# Patient Record
Sex: Male | Born: 1960 | Race: White | Hispanic: No | Smoking: Current every day smoker
Health system: Southern US, Community
[De-identification: ages and names within clinical notes are randomized; demographics above are authoritative.]

## PROBLEM LIST (undated history)

## (undated) DIAGNOSIS — I429 Cardiomyopathy, unspecified: Secondary | ICD-10-CM

## (undated) DIAGNOSIS — S069XAA Unspecified intracranial injury with loss of consciousness status unknown, initial encounter: Secondary | ICD-10-CM

## (undated) DIAGNOSIS — G4730 Sleep apnea, unspecified: Secondary | ICD-10-CM

## (undated) DIAGNOSIS — J449 Chronic obstructive pulmonary disease, unspecified: Secondary | ICD-10-CM

## (undated) DIAGNOSIS — R569 Unspecified convulsions: Secondary | ICD-10-CM

## (undated) DIAGNOSIS — I10 Essential (primary) hypertension: Secondary | ICD-10-CM

## (undated) DIAGNOSIS — S069X9A Unspecified intracranial injury with loss of consciousness of unspecified duration, initial encounter: Secondary | ICD-10-CM

## (undated) HISTORY — PX: LEG SURGERY: SHX1003

## (undated) HISTORY — PX: APPENDECTOMY: SHX54

---

## 2008-11-11 HISTORY — PX: CARDIAC CATHETERIZATION: SHX172

## 2012-12-25 ENCOUNTER — Encounter (HOSPITAL_COMMUNITY): Payer: Self-pay

## 2012-12-25 ENCOUNTER — Other Ambulatory Visit: Payer: Self-pay

## 2012-12-25 ENCOUNTER — Emergency Department (HOSPITAL_COMMUNITY): Payer: Medicaid Other

## 2012-12-25 ENCOUNTER — Emergency Department (HOSPITAL_COMMUNITY)
Admission: EM | Admit: 2012-12-25 | Discharge: 2012-12-25 | Disposition: A | Discharge status: Home / Self Care | Payer: Medicaid Other | Attending: Emergency Medicine | Admitting: Emergency Medicine

## 2012-12-25 DIAGNOSIS — F17200 Nicotine dependence, unspecified, uncomplicated: Secondary | ICD-10-CM | POA: Insufficient documentation

## 2012-12-25 DIAGNOSIS — S0993XA Unspecified injury of face, initial encounter: Secondary | ICD-10-CM | POA: Insufficient documentation

## 2012-12-25 DIAGNOSIS — S46909A Unspecified injury of unspecified muscle, fascia and tendon at shoulder and upper arm level, unspecified arm, initial encounter: Secondary | ICD-10-CM | POA: Insufficient documentation

## 2012-12-25 DIAGNOSIS — S161XXA Strain of muscle, fascia and tendon at neck level, initial encounter: Secondary | ICD-10-CM

## 2012-12-25 DIAGNOSIS — R0602 Shortness of breath: Secondary | ICD-10-CM | POA: Insufficient documentation

## 2012-12-25 DIAGNOSIS — I10 Essential (primary) hypertension: Secondary | ICD-10-CM | POA: Insufficient documentation

## 2012-12-25 DIAGNOSIS — R404 Transient alteration of awareness: Secondary | ICD-10-CM | POA: Insufficient documentation

## 2012-12-25 DIAGNOSIS — S139XXA Sprain of joints and ligaments of unspecified parts of neck, initial encounter: Secondary | ICD-10-CM | POA: Insufficient documentation

## 2012-12-25 DIAGNOSIS — R55 Syncope and collapse: Secondary | ICD-10-CM | POA: Insufficient documentation

## 2012-12-25 DIAGNOSIS — S0291XA Unspecified fracture of skull, initial encounter for closed fracture: Secondary | ICD-10-CM

## 2012-12-25 DIAGNOSIS — Y9289 Other specified places as the place of occurrence of the external cause: Secondary | ICD-10-CM | POA: Insufficient documentation

## 2012-12-25 DIAGNOSIS — S0280XA Fracture of other specified skull and facial bones, unspecified side, initial encounter for closed fracture: Secondary | ICD-10-CM | POA: Insufficient documentation

## 2012-12-25 DIAGNOSIS — W010XXA Fall on same level from slipping, tripping and stumbling without subsequent striking against object, initial encounter: Secondary | ICD-10-CM | POA: Insufficient documentation

## 2012-12-25 DIAGNOSIS — Z79899 Other long term (current) drug therapy: Secondary | ICD-10-CM | POA: Insufficient documentation

## 2012-12-25 DIAGNOSIS — J40 Bronchitis, not specified as acute or chronic: Secondary | ICD-10-CM

## 2012-12-25 DIAGNOSIS — S0990XA Unspecified injury of head, initial encounter: Secondary | ICD-10-CM | POA: Insufficient documentation

## 2012-12-25 DIAGNOSIS — S4980XA Other specified injuries of shoulder and upper arm, unspecified arm, initial encounter: Secondary | ICD-10-CM | POA: Insufficient documentation

## 2012-12-25 DIAGNOSIS — Y9389 Activity, other specified: Secondary | ICD-10-CM | POA: Insufficient documentation

## 2012-12-25 HISTORY — DX: Sleep apnea, unspecified: G47.30

## 2012-12-25 HISTORY — DX: Essential (primary) hypertension: I10

## 2012-12-25 LAB — CBC WITH DIFFERENTIAL/PLATELET
Eosinophils Relative: 4 % (ref 0–5)
HCT: 47.7 % (ref 39.0–52.0)
Lymphocytes Relative: 31 % (ref 12–46)
Lymphs Abs: 1.8 10*3/uL (ref 0.7–4.0)
MCV: 93.7 fL (ref 78.0–100.0)
Monocytes Absolute: 0.6 10*3/uL (ref 0.1–1.0)
Monocytes Relative: 9 % (ref 3–12)
RBC: 5.09 MIL/uL (ref 4.22–5.81)
WBC: 6 10*3/uL (ref 4.0–10.5)

## 2012-12-25 LAB — BASIC METABOLIC PANEL
CO2: 28 mEq/L (ref 19–32)
Calcium: 10 mg/dL (ref 8.4–10.5)
Creatinine, Ser: 1.02 mg/dL (ref 0.50–1.35)
Glucose, Bld: 94 mg/dL (ref 70–99)

## 2012-12-25 MED ORDER — OXYCODONE-ACETAMINOPHEN 5-325 MG PO TABS
2.0000 | ORAL_TABLET | Freq: Once | ORAL | Status: AC
  Administered 2012-12-25: 2 via ORAL
  Filled 2012-12-25: qty 2

## 2012-12-25 MED ORDER — BENZONATATE 200 MG PO CAPS: 200.0000 mg | ORAL_CAPSULE | Freq: Three times a day (TID) | ORAL | Status: DC | PRN

## 2012-12-25 MED ORDER — OXYCODONE-ACETAMINOPHEN 5-325 MG PO TABS: 1.0000 | ORAL_TABLET | Freq: Four times a day (QID) | ORAL | Status: DC | PRN
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
## 2012-12-25 NOTE — ED Notes (Signed)
Pt presents with c/o cough, nasal congestion, syncope episode with coughing last night. Pt reports hurt his neck and upper back last night during episode. Pt denies SOB and fever at this time. NAD noted no cough audible during exam. Pt reports coughing yellow sputum x 10 days. NAD noted at this time.

## 2012-12-25 NOTE — ED Notes (Signed)
Pt called to desk c/o SOB. Upon entering room pt did no appear to have increased WOB from previous exam. Pt still able to carry on conversation without signs of being winded.  EDP notified.

## 2012-12-25 NOTE — ED Notes (Signed)
Pt reports has been coughing for approx 10days.  Reports cough is productive with yellow sputum.  Last night pt had a coughing spell so he walked outside.  Reports slipped on wet grass, hit head neck on tree stump.  Pt says woke up feeling stiff all over this morning.  Unknown if has had fever.

## 2012-12-26 ENCOUNTER — Encounter (HOSPITAL_COMMUNITY): Payer: Self-pay | Admitting: Emergency Medicine

## 2012-12-26 NOTE — ED Provider Notes (Signed)
Medical screening examination/treatment/procedure(s) were performed by non-physician practitioner and as supervising physician I was immediately available for consultation/collaboration.   Shamel Galyean L Jordis Repetto, MD 12/26/12 1624 

## 2013-01-03 ENCOUNTER — Emergency Department (HOSPITAL_COMMUNITY)
Admission: EM | Admit: 2013-01-03 | Discharge: 2013-01-03 | Disposition: A | Discharge status: Home / Self Care | Payer: 59 | Attending: Emergency Medicine | Admitting: Emergency Medicine

## 2013-01-03 ENCOUNTER — Emergency Department (HOSPITAL_COMMUNITY): Payer: 59

## 2013-01-03 ENCOUNTER — Encounter (HOSPITAL_COMMUNITY): Payer: Self-pay | Admitting: *Deleted

## 2013-01-03 DIAGNOSIS — E876 Hypokalemia: Secondary | ICD-10-CM | POA: Insufficient documentation

## 2013-01-03 DIAGNOSIS — R569 Unspecified convulsions: Secondary | ICD-10-CM | POA: Insufficient documentation

## 2013-01-03 DIAGNOSIS — F17200 Nicotine dependence, unspecified, uncomplicated: Secondary | ICD-10-CM | POA: Insufficient documentation

## 2013-01-03 DIAGNOSIS — I10 Essential (primary) hypertension: Secondary | ICD-10-CM | POA: Insufficient documentation

## 2013-01-03 DIAGNOSIS — Z79899 Other long term (current) drug therapy: Secondary | ICD-10-CM | POA: Insufficient documentation

## 2013-01-03 DIAGNOSIS — F1010 Alcohol abuse, uncomplicated: Secondary | ICD-10-CM

## 2013-01-03 HISTORY — DX: Unspecified intracranial injury with loss of consciousness of unspecified duration, initial encounter: S06.9X9A

## 2013-01-03 HISTORY — DX: Unspecified intracranial injury with loss of consciousness status unknown, initial encounter: S06.9XAA

## 2013-01-03 HISTORY — DX: Unspecified convulsions: R56.9

## 2013-01-03 LAB — BASIC METABOLIC PANEL
GFR calc non Af Amer: >90 mL/min (ref >90)
Glucose, Bld: 110 mg/dL — ABNORMAL HIGH (ref 70–99)
Potassium: 2.9 mEq/L — ABNORMAL LOW (ref 3.5–5.1)
Sodium: 135 mEq/L (ref 135–145)

## 2013-01-03 LAB — CBC WITH DIFFERENTIAL/PLATELET
Basophils Absolute: 0 10*3/uL (ref 0.0–0.1)
Eosinophils Absolute: 0.1 10*3/uL (ref 0.0–0.7)
Lymphocytes Relative: 14 % (ref 12–46)
Lymphs Abs: 1.2 10*3/uL (ref 0.7–4.0)
MCH: 33.5 pg (ref 26.0–34.0)
Neutrophils Relative %: 77 % (ref 43–77)
Platelets: 152 10*3/uL (ref 150–400)
RBC: 4.24 MIL/uL (ref 4.22–5.81)
RDW: 13.7 % (ref 11.5–15.5)
WBC: 7.8 10*3/uL (ref 4.0–10.5)

## 2013-01-03 LAB — URINALYSIS, ROUTINE W REFLEX MICROSCOPIC
Hgb urine dipstick: NEGATIVE
Leukocytes, UA: NEGATIVE
Nitrite: NEGATIVE
Specific Gravity, Urine: 1.025 (ref 1.005–1.030)
Urobilinogen, UA: 0.2 mg/dL (ref 0.0–1.0)

## 2013-01-03 LAB — RAPID URINE DRUG SCREEN, HOSP PERFORMED
Amphetamines: NOT DETECTED
Tetrahydrocannabinol: POSITIVE — AB

## 2013-01-03 MED ORDER — POTASSIUM CHLORIDE CRYS ER 20 MEQ PO TBCR
60.0000 meq | EXTENDED_RELEASE_TABLET | Freq: Once | ORAL | Status: AC
Start: 2013-01-03 — End: 2013-01-03
  Administered 2013-01-03: 60 meq via ORAL
  Filled 2013-01-03: qty 3

## 2013-01-03 MED ORDER — POTASSIUM CHLORIDE 10 MEQ/100ML IV SOLN
10.0000 meq | Freq: Once | INTRAVENOUS | Status: AC
  Administered 2013-01-03: 10 meq via INTRAVENOUS
  Filled 2013-01-03: qty 100
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
## 2013-01-03 NOTE — ED Notes (Signed)
Left message for pt's father to come pick pt up.

## 2013-01-03 NOTE — ED Notes (Addendum)
Pt brought to er by [employer] with c/o altered mental status, ems contacted by family with c/o unresponsive, on ems arrival pt unresponsive to painful stimuli, report that they found the pt unresponsive when they went to "check on him" states that pt had complained of chest pain earlier, pt given 2 mg narcan iv by ems with minimal improvement in LOC. Pt has empty pill bottle of lorazepam 0.5mg  filled on 12/21/2012 that was empty. On arrival to er pt not responding to painful stimuli. Foley cath and additional iv placed with no response from pt,  Pt placed on oxygen at 3lpm via [place] with improvement in sat. To 98% VSS.

## 2013-01-03 NOTE — ED Notes (Signed)
Pt started stretching his arm, eyes open, smells of etoh, will respond to questions with grunts, pulling at foley, foley dc'd, Dr. Colon Branch at bedside, sister called er and advised that pt had a seizure about two hours ago and was slow to "come back" pt shakes his head yes when asked if he has had problems with seizures. Seizure pads in place on stretcher,

## 2013-02-12 ENCOUNTER — Emergency Department (HOSPITAL_COMMUNITY): Payer: Medicaid Other

## 2013-02-12 ENCOUNTER — Encounter (HOSPITAL_COMMUNITY): Payer: Self-pay | Admitting: *Deleted

## 2013-02-12 ENCOUNTER — Emergency Department (HOSPITAL_COMMUNITY)
Admission: EM | Admit: 2013-02-12 | Discharge: 2013-02-12 | Disposition: A | Discharge status: Home / Self Care | Payer: Medicaid Other | Attending: Emergency Medicine | Admitting: Emergency Medicine

## 2013-02-12 DIAGNOSIS — R059 Cough, unspecified: Secondary | ICD-10-CM | POA: Insufficient documentation

## 2013-02-12 DIAGNOSIS — R55 Syncope and collapse: Secondary | ICD-10-CM | POA: Insufficient documentation

## 2013-02-12 DIAGNOSIS — Z79899 Other long term (current) drug therapy: Secondary | ICD-10-CM | POA: Insufficient documentation

## 2013-02-12 DIAGNOSIS — Z87828 Personal history of other (healed) physical injury and trauma: Secondary | ICD-10-CM | POA: Insufficient documentation

## 2013-02-12 DIAGNOSIS — I10 Essential (primary) hypertension: Secondary | ICD-10-CM | POA: Insufficient documentation

## 2013-02-12 DIAGNOSIS — R05 Cough: Secondary | ICD-10-CM | POA: Insufficient documentation

## 2013-02-12 DIAGNOSIS — R51 Headache: Secondary | ICD-10-CM | POA: Insufficient documentation

## 2013-02-12 DIAGNOSIS — M542 Cervicalgia: Secondary | ICD-10-CM | POA: Insufficient documentation

## 2013-02-12 DIAGNOSIS — Z8669 Personal history of other diseases of the nervous system and sense organs: Secondary | ICD-10-CM | POA: Insufficient documentation

## 2013-02-12 DIAGNOSIS — Z720 Tobacco use: Secondary | ICD-10-CM

## 2013-02-12 DIAGNOSIS — F17200 Nicotine dependence, unspecified, uncomplicated: Secondary | ICD-10-CM | POA: Insufficient documentation

## 2013-02-12 MED ORDER — ACETAMINOPHEN 325 MG PO TABS
650.0000 mg | ORAL_TABLET | Freq: Once | ORAL | Status: AC
Start: 2013-02-12 — End: 2013-02-12
  Administered 2013-02-12: 650 mg via ORAL
  Filled 2013-02-12: qty 2

## 2013-02-12 NOTE — ED Notes (Signed)
Pt has been seen recently for same. Reports having a cough and its progressively worsening, everytime he coughs he passes out. Reports coughing this am and falling and hit back of head on concrete. No acute distress noted at this time.
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
## 2013-02-14 NOTE — ED Provider Notes (Signed)
I saw and evaluated the patient, reviewed the resident's note and I agree with the findings and plan.  52 yo male with h/o syncope a/w coughing. This is a chronic issue.  Pt has h/o TBI.  He has been seen for these syncopal episodes in the past and has a current neurology c/s pending, but is not interested in following up with neurology.  On exam - AFVSS, normal neuro exam, speech, stance, MS, gait.  Small hematoma in occipital area.  CT head and neck are negative.    Pt encouraged to stop smoking and to f/u with neurology and PCM.  ER precautions given.    Darlys Gales, MD 02/14/13 [phone]

## 2013-02-22 ENCOUNTER — Encounter (HOSPITAL_COMMUNITY): Payer: Self-pay | Admitting: Emergency Medicine

## 2013-02-22 ENCOUNTER — Observation Stay (HOSPITAL_COMMUNITY)
Admission: EM | Admit: 2013-02-22 | Discharge: 2013-02-23 | Disposition: A | Discharge status: Home / Self Care | Payer: Medicaid Other | Attending: Internal Medicine | Admitting: Internal Medicine

## 2013-02-22 ENCOUNTER — Emergency Department (HOSPITAL_COMMUNITY): Payer: Medicaid Other

## 2013-02-22 DIAGNOSIS — R079 Chest pain, unspecified: Secondary | ICD-10-CM | POA: Diagnosis present

## 2013-02-22 DIAGNOSIS — Z720 Tobacco use: Secondary | ICD-10-CM | POA: Diagnosis present

## 2013-02-22 DIAGNOSIS — I10 Essential (primary) hypertension: Secondary | ICD-10-CM | POA: Insufficient documentation

## 2013-02-22 DIAGNOSIS — Z79899 Other long term (current) drug therapy: Secondary | ICD-10-CM | POA: Insufficient documentation

## 2013-02-22 DIAGNOSIS — G4730 Sleep apnea, unspecified: Secondary | ICD-10-CM | POA: Insufficient documentation

## 2013-02-22 DIAGNOSIS — R0789 Other chest pain: Principal | ICD-10-CM | POA: Insufficient documentation

## 2013-02-22 DIAGNOSIS — F17200 Nicotine dependence, unspecified, uncomplicated: Secondary | ICD-10-CM | POA: Insufficient documentation

## 2013-02-22 DIAGNOSIS — Z87820 Personal history of traumatic brain injury: Secondary | ICD-10-CM | POA: Insufficient documentation

## 2013-02-22 DIAGNOSIS — G4733 Obstructive sleep apnea (adult) (pediatric): Secondary | ICD-10-CM

## 2013-02-22 DIAGNOSIS — F1010 Alcohol abuse, uncomplicated: Secondary | ICD-10-CM | POA: Diagnosis present

## 2013-02-22 HISTORY — DX: Cardiomyopathy, unspecified: I42.9

## 2013-02-22 HISTORY — DX: Chronic obstructive pulmonary disease, unspecified: J44.9

## 2013-02-22 LAB — CBC
HCT: 46.5 % (ref 39.0–52.0)
HCT: 44.6 % (ref 39.0–52.0)
Hemoglobin: 16.6 g/dL (ref 13.0–17.0)
Hemoglobin: 15.9 g/dL (ref 13.0–17.0)
MCHC: 35.7 g/dL (ref 30.0–36.0)
MCV: 94.7 fL (ref 78.0–100.0)
MCV: 95.7 fL (ref 78.0–100.0)
RBC: 4.91 MIL/uL (ref 4.22–5.81)
WBC: 7.2 10*3/uL (ref 4.0–10.5)
WBC: 5.1 10*3/uL (ref 4.0–10.5)

## 2013-02-22 LAB — POCT I-STAT TROPONIN I: Troponin i, poc: 0 ng/mL (ref 0.00–0.08)

## 2013-02-22 LAB — POCT I-STAT 3, ART BLOOD GAS (G3+)
Acid-base deficit: 3 mmol/L — ABNORMAL HIGH (ref 0.0–2.0)
O2 Saturation: 95 %
TCO2: 23 mmol/L (ref 0–100)

## 2013-02-22 LAB — BASIC METABOLIC PANEL
BUN: 18 mg/dL (ref 6–23)
CO2: 19 mEq/L (ref 19–32)
Calcium: 10.1 mg/dL (ref 8.4–10.5)
Chloride: 101 mEq/L (ref 96–112)
Creatinine, Ser: 1.11 mg/dL (ref 0.50–1.35)
GFR calc Af Amer: 87 mL/min — ABNORMAL LOW (ref >90)
GFR calc non Af Amer: 75 mL/min — ABNORMAL LOW (ref >90)
Glucose, Bld: 85 mg/dL (ref 70–99)

## 2013-02-22 LAB — LIPID PANEL
HDL: 53 mg/dL (ref >39)
LDL Cholesterol: 82 mg/dL (ref 0–99)
Triglycerides: 57 mg/dL (ref <150)

## 2013-02-22 LAB — TROPONIN I: Troponin I: <0.3 ng/mL (ref <0.30)

## 2013-02-22 LAB — CREATININE, SERUM
GFR calc Af Amer: >90 mL/min (ref >90)
GFR calc non Af Amer: >90 mL/min (ref >90)

## 2013-02-22 MED ORDER — THIAMINE HCL 100 MG/ML IJ SOLN
100.0000 mg | Freq: Every day | INTRAMUSCULAR | Status: DC
  Filled 2013-02-22 (×2): qty 1

## 2013-02-22 MED ORDER — ONDANSETRON HCL 4 MG/2ML IJ SOLN
4.0000 mg | Freq: Once | INTRAMUSCULAR | Status: AC
  Administered 2013-02-22: 4 mg via INTRAVENOUS

## 2013-02-22 MED ORDER — SODIUM CHLORIDE 0.9 % IV BOLUS (SEPSIS)
500.0000 mL | Freq: Once | INTRAVENOUS | Status: AC
  Administered 2013-02-22: 500 mL via INTRAVENOUS

## 2013-02-22 MED ORDER — METOPROLOL TARTRATE 25 MG PO TABS
25.0000 mg | ORAL_TABLET | Freq: Two times a day (BID) | ORAL | Status: DC
  Administered 2013-02-22: 25 mg via ORAL
  Filled 2013-02-22 (×4): qty 1

## 2013-02-22 MED ORDER — ONDANSETRON HCL 4 MG/2ML IJ SOLN
4.0000 mg | Freq: Three times a day (TID) | INTRAMUSCULAR | Status: DC
  Filled 2013-02-22: qty 2

## 2013-02-22 MED ORDER — FOLIC ACID 1 MG PO TABS
1.0000 mg | ORAL_TABLET | Freq: Every day | ORAL | Status: DC
  Administered 2013-02-22 – 2013-02-23 (×2): 1 mg via ORAL
  Filled 2013-02-22 (×2): qty 1

## 2013-02-22 MED ORDER — ONDANSETRON HCL 4 MG/2ML IJ SOLN: 4.0000 mg | Freq: Four times a day (QID) | INTRAMUSCULAR | Status: DC | PRN

## 2013-02-22 MED ORDER — HYDROCODONE-ACETAMINOPHEN 7.5-325 MG PO TABS
1.0000 | ORAL_TABLET | ORAL | Status: DC | PRN
  Administered 2013-02-22 – 2013-02-23 (×5): 1 via ORAL
  Filled 2013-02-22 (×5): qty 1

## 2013-02-22 MED ORDER — ALBUTEROL SULFATE (5 MG/ML) 0.5% IN NEBU: 2.5000 mg | INHALATION_SOLUTION | RESPIRATORY_TRACT | Status: DC | PRN

## 2013-02-22 MED ORDER — IPRATROPIUM BROMIDE 0.02 % IN SOLN: 0.5000 mg | RESPIRATORY_TRACT | Status: DC | PRN

## 2013-02-22 MED ORDER — LORAZEPAM 1 MG PO TABS: 1.0000 mg | ORAL_TABLET | Freq: Four times a day (QID) | ORAL | Status: DC | PRN

## 2013-02-22 MED ORDER — ASPIRIN 81 MG PO CHEW
324.0000 mg | CHEWABLE_TABLET | Freq: Once | ORAL | Status: AC
  Administered 2013-02-22: 324 mg via ORAL
  Filled 2013-02-22: qty 4

## 2013-02-22 MED ORDER — ADULT MULTIVITAMIN W/MINERALS CH
1.0000 | ORAL_TABLET | Freq: Every day | ORAL | Status: DC
  Administered 2013-02-22 – 2013-02-23 (×2): 1 via ORAL
  Filled 2013-02-22 (×2): qty 1

## 2013-02-22 MED ORDER — MORPHINE SULFATE 2 MG/ML IJ SOLN: 2.0000 mg | INTRAMUSCULAR | Status: DC | PRN

## 2013-02-22 MED ORDER — VITAMIN B-1 100 MG PO TABS
100.0000 mg | ORAL_TABLET | Freq: Every day | ORAL | Status: DC
  Administered 2013-02-22 – 2013-02-23 (×2): 100 mg via ORAL
  Filled 2013-02-22 (×2): qty 1

## 2013-02-22 MED ORDER — MORPHINE SULFATE 4 MG/ML IJ SOLN
4.0000 mg | Freq: Once | INTRAMUSCULAR | Status: DC
  Filled 2013-02-22: qty 1

## 2013-02-22 MED ORDER — ACETAMINOPHEN 325 MG PO TABS: 650.0000 mg | ORAL_TABLET | ORAL | Status: DC | PRN

## 2013-02-22 MED ORDER — ASPIRIN EC 325 MG PO TBEC
325.0000 mg | DELAYED_RELEASE_TABLET | Freq: Every day | ORAL | Status: DC
  Administered 2013-02-23: 325 mg via ORAL
  Filled 2013-02-22 (×2): qty 1

## 2013-02-22 MED ORDER — LORAZEPAM 2 MG/ML IJ SOLN: 1.0000 mg | Freq: Four times a day (QID) | INTRAMUSCULAR | Status: DC | PRN

## 2013-02-22 MED ORDER — NITROGLYCERIN 0.4 MG SL SUBL
0.4000 mg | SUBLINGUAL_TABLET | SUBLINGUAL | Status: AC | PRN
  Administered 2013-02-22 (×3): 0.4 mg via SUBLINGUAL
  Filled 2013-02-22: qty 25

## 2013-02-22 MED ORDER — GI COCKTAIL ~~LOC~~: 30.0000 mL | Freq: Four times a day (QID) | ORAL | Status: DC | PRN

## 2013-02-22 MED ORDER — HEPARIN SODIUM (PORCINE) 5000 UNIT/ML IJ SOLN
5000.0000 [IU] | Freq: Three times a day (TID) | INTRAMUSCULAR | Status: DC
  Administered 2013-02-22 – 2013-02-23 (×3): 5000 [IU] via SUBCUTANEOUS
  Filled 2013-02-22 (×6): qty 1

## 2013-02-22 NOTE — ED Notes (Signed)
PT saturations decreasing to 91 on 2 L.  Pt in upright position.  O2 increased to 4L, o2 if 91.  Pt states he feels "foggy".

## 2013-02-22 NOTE — ED Provider Notes (Signed)
Medical screening examination/treatment/procedure(s) were conducted as a shared visit with non-physician practitioner(s) and myself.  I personally evaluated the patient during the encounter.  Several hours of chest pressure tightness heaviness shortness breath nausea sweats improving and now minimal with nitroglycerin, lungs clear however on 4 L nasal cannula oxygen while feeling sleepy but awake and oriented x3 the patient desaturated to 89% however spontaneously improved back to 95% so d-dimer ordered after initial ECG and troponin are unremarkable.  Hurman Horn, MD 02/23/13 [phone]

## 2013-02-22 NOTE — H&P (Signed)
Date: 02/22/2013               Patient Name:  JAKAI RISSE MRN: [id number]  DOB: 10-24-1960 Age / Sex: 52 y.o., male   PCP: Dr. Chestine Spore Pritts ([place] Croon, [place])         Medical Service: Internal Medicine Teaching Service         Attending Physician: Dr. Hurman Horn, MD    First Contact: Dr. Doristine Locks Pager: [phone]  Second Contact: Dr. Sinclair Ship Pager: [phone]       After Hours (After 5p/  First Contact Pager: [phone]  weekends / holidays): Second Contact Pager: [phone]   Chief Complaint: chest pain  History of Present Illness: Mr. Plessinger is a 52 yo male with history significant for hypertension, obstructive sleep apnea, GERD, tobacco abuse, and periodic alcohol abuse who presents with complaints of left sided chest pressure with abrupt onset on morning of presentation to the ED.  He states that he woke up not "feeling quite right" but without chest pain and as he was preparing for work he suddenly felt mid-chest pressure which resolved spontaneously with 1-2 minutes.  Within 15 minutes more intense chest pain "brought me to my knees" and was associated with diaphoresis, shortness of breath, nausea, and radiation to his left neck and arm.  Not associated with change in position or inspiration. Pain resolved ED after nitroglycerin and aspirin. He states that this pain felt similar to his chest pain experienced in 2010 whereby he subsequently had cardiac catherization demonstrating non-obstructive CAD.  He has not had chest pain since that time.  He has been without his blood pressure and GERD medications for ~2-3 weeks.  Meds: Current Facility-Administered Medications  Medication Dose Route Frequency Provider Last Rate Last Dose  . morphine 4 MG/ML injection 4 mg  4 mg Intravenous Once Fayrene Helper, PA-C       Current Outpatient Prescriptions  Medication Sig Dispense Refill  . calcium carbonate (TUMS - DOSED IN MG ELEMENTAL CALCIUM) 500 MG chewable tablet Chew 1 tablet by mouth  daily as needed for heartburn.      Marland Kitchen HYDROcodone-acetaminophen (NORCO) 7.5-325 MG per tablet Take 1 tablet by mouth every 4 (four) hours as needed for pain.       Marland Kitchen LORazepam (ATIVAN) 0.5 MG tablet Take 0.5 mg by mouth every 6 (six) hours as needed for anxiety.       . metoprolol (LOPRESSOR) 50 MG tablet Take 50 mg by mouth daily.      . Naproxen Sodium (ALEVE PO) Take 2 tablets by mouth daily as needed (headache).            Zantac OTC  Allergies: Allergies as of 02/22/2013  . (No Known Allergies)   Past Medical History  Diagnosis Date  . Hypertension   . Sleep apnea   . Traumatic brain injury     2013  . Cardiomyopathy   . Seizures    Past Surgical History  Procedure Laterality Date  . Appendectomy    . Leg surgery    . Cardiac catheterization  11/2008   No family history on file. History   Social History  . Marital Status: Widowed    Spouse Name: N/A    Number of Children: N/A  . Years of Education: N/A   Occupational History  . Not on file.   Social History Main Topics  . Smoking status: Current Every Day Smoker  . Smokeless tobacco: Not on  file  . Alcohol Use: No  . Drug Use: No  . Sexual Activity: Not on file   Other Topics Concern  . Not on file   Social History Narrative  . No narrative on file    Review of Systems: Constitutional:  Endorse diaphoresis, Denies fever, chills, appetite change and fatigue.  HEENT: Denies congestion, sore throat, rhinorrhea  Respiratory: Endorse SOB with chest pain episode, Denies DOE, cough, chest tightness, and wheezing.  Cardiovascular: Endorses chest pain and palpitations, Denies leg swelling.  Gastrointestinal: Endorses nausea, Denies vomiting, abdominal pain, diarrhea, constipation, blood in stool  Genitourinary: Denies dysuria, urgency, frequency, hematuria, flank pain and difficulty urinating.  Musculoskeletal: Endorses chronic back pain, Denies myalgias, joint swelling   Skin: Denies rash  Neurological: Told  he previously had seizure after drinking ETOH, h/o syncope after coughing, Denies dizziness, weakness, light-headedness, numbness and headaches.   Psychiatric/ Behavioral: Endorses depressed mood, Denies suicidal ideation     Physical Exam: Blood pressure 125/68, pulse 67, temperature 97.9 F (36.6 C), temperature source Oral, resp. rate 17, SpO2 98.00%. General: Well-developed, well-nourished, White male, in no acute distress; Head: Normocephalic, atraumatic, small well healed laceration to top of scalp, PERRLA, EOMI, Moist mucous membranes Neck: supple, no masses, no carotid Bruits, no JVD appreciated. Lungs: Normal respiratory effort. Clear to auscultation bilaterally from apices to bases without crackles or wheezes appreciated. Heart: normal rate, regular rhythm, normal S1 and S2, no gallop, murmur, or rubs appreciated. Abdomen: BS normoactive. Soft, Nondistended, non-tender. No masses or organomegaly appreciated. Extremities: No pretibial edema, distal pulses intact Neurologic: grossly non-focal, alert and oriented x3, appropriate and cooperative throughout examination.   Lab results: Basic Metabolic Panel:  Recent Labs  16/10/96 0554  NA 136  K 3.9  CL 101  CO2 19  GLUCOSE 85  BUN 18  CREATININE 1.11  CALCIUM 10.1   CBC:  Recent Labs  02/22/13 0554  WBC 7.2  HGB 16.6  HCT 46.5  MCV 94.7  PLT 178   BNP:  Recent Labs  02/22/13 0554  PROBNP 32.3   D-Dimer:  Recent Labs  02/22/13 0928  DDIMER <0.27   Urine Drug Screen: Drugs of Abuse     Component Value Date/Time   LABOPIA NONE DETECTED 01/03/2013 0155   COCAINSCRNUR NONE DETECTED 01/03/2013 0155   LABBENZ NONE DETECTED 01/03/2013 0155   AMPHETMU NONE DETECTED 01/03/2013 0155   THCU POSITIVE* 01/03/2013 0155   LABBARB NONE DETECTED 01/03/2013 0155    Alcohol Level:  Recent Labs  02/22/13 1037  ETH <11   Troponin (Point of Care Test)  Recent Labs  02/22/13 0605  TROPIPOC 0.00      Imaging results:  Dg Chest 2 View  02/22/2013   *RADIOLOGY REPORT*  Clinical Data: Chest pain  CHEST - 2 VIEW  Comparison: 12/25/2012  Findings: Heart size and vascularity are normal.  Negative for heart failure.  Mild left lower lobe atelectasis.  Right lung is clear.  Negative for pleural effusion.  IMPRESSION: Mild left lower lobe atelectasis.   Original Report Authenticated By: Janeece Riggers, M.D.    Other results: EKG: normal EKG, normal sinus rhythm, unchanged from previous tracings.  Assessment & Plan by Problem: Mr. Hanners is a 52 yo male with hypertension, tobacco abuse and positive cardiac family history admitted with sudden onset chest pain concerning for acute coronary syndrome.  1. Chest pain: likely unstable angina.  Thus far troponins negative with EKG without ischemic changes.Pulmonary Embolism low likelihood given low Wells and  Geneva Score with negative D-dimer. History and CXR without signs of dissection or esophageal rupture; clinical exam, history and EKG without pericarditis indicators; pt has been without PPI for several weeks but denies cholecystitis symptoms or increased reflux symptoms including no right subcostal pain, epigastric pain, melena, anorexia, vomiting or fever. TIMI score of 2 indicating 8% risk at 14 days for all cause mortality.  Given his risk factors of hypertension and tobacco abuse will admit for further evaluation. -cycle troponins -EKG with repeat chest pain -2D ECHO -check lipid panel -cont beta-blocker -cont ASA -consider statin and ACEi therapy   2. Hypertension: controlled currently at 120/66 on exam with pulse 78bpm -will continue metoprolol 50 mg qd home dosage but divide bid -consider ACEi  3. Tobacco abuse: currently not motivated to stop.  Under stress and grief.  4. Alcohol abuse: pt open to grief counseling during hospitalization -CIWA precautions  5. DVT ppx: Hep SQ   Dispo: Disposition is deferred at this time, awaiting  improvement of current medical problems. Anticipated discharge in approximately 2-3 day(s).   The patient does have a current PCP (Dr. Loren Racer) and does not need an [facility] hospital follow-up appointment after discharge.  The patient does not have transportation limitations that hinder transportation to clinic appointments.  Signed: Manuela Schwartz, MD 02/22/2013, 11:32 AM

## 2013-02-22 NOTE — ED Notes (Signed)
Pt ambulated with assistance.  O2 decreased to 93% with hr of 72. Denied sob or chest pain.

## 2013-02-22 NOTE — ED Notes (Signed)
Pt. reports mid chest pain / pressure with SOB , nausea and diaphoresis onset this morning .

## 2013-02-22 NOTE — ED Notes (Signed)
Woke up this morning with x 2 chest tightness and nausea; hx. Of vertigol

## 2013-02-22 NOTE — ED Provider Notes (Signed)
CSN: [id number]     Arrival date & time 02/22/13  0544 History   First MD Initiated Contact with Patient 02/22/13 [phone]     Chief Complaint  Patient presents with  . Chest Pain   (Consider location/radiation/quality/duration/timing/severity/associated sxs/prior Treatment) HPI  52 year old male with hx of TBI in 2013, HTN, seizure presents for evaluation of chest discomfort.  Patient reports this morning around 4am when he was getting ready to go to walk he experienced a sensation of chest pressure to his mid chest radiates to his left shoulder. Onset was gradual for the first episode but sudden on the second episode.  Describes sensation as "a car sitting on my chest". He endorsed nausea, lightheadedness, shortness of breath and diaphoretic. Symptoms lasting for several minutes, resolved, came back again for additional 10 minutes, improves and then worsening again. Nothing seems to make the symptoms worse but resting seems to make it better. Describe pain as a 4/10 at this time.  Nausea seems to improve after receiving Zofran. No complaints of fever, chills, headache, productive cough, abdominal pain, back pain, numbness or weakness. He does report significant family history of cardiac disease. He also had a cardiac stress test with cardiac catheterization done 2010. States he does have minor blockage on heart catheterization. Patient felt today's symptoms reminiscing the previous event in 2010 when he was hospitalized for chest pain.  He is a smoker but trying to quit.  No prior hx of PE/DVT, no recent surgery, prolonged bed rest, recent travel, or hx of CA.     Past Medical History  Diagnosis Date  . Hypertension   . Sleep apnea   . Seizures   . Traumatic brain injury     2013   Past Surgical History  Procedure Laterality Date  . Appendectomy    . Leg surgery     No family history on file. History  Substance Use Topics  . Smoking status: Current Every Day Smoker  . Smokeless tobacco:  Not on file  . Alcohol Use: No    Review of Systems  All other systems reviewed and are negative.    Allergies  Review of patient's allergies indicates no known allergies.  Home Medications   Current Outpatient Rx  Name  Route  Sig  Dispense  Refill  . calcium carbonate (TUMS - DOSED IN MG ELEMENTAL CALCIUM) 500 MG chewable tablet   Oral   Chew 1 tablet by mouth daily as needed for heartburn.         Marland Kitchen HYDROcodone-acetaminophen (NORCO) 7.5-325 MG per tablet   Oral   Take 1 tablet by mouth every 4 (four) hours as needed for pain.          Marland Kitchen LORazepam (ATIVAN) 0.5 MG tablet   Oral   Take 0.5 mg by mouth every 6 (six) hours as needed for anxiety.          . metoprolol (LOPRESSOR) 50 MG tablet   Oral   Take 50 mg by mouth daily.         . Naproxen Sodium (ALEVE PO)   Oral   Take 2 tablets by mouth daily as needed (headache).          BP 142/82  Pulse 86  Temp(Src) 97.9 F (36.6 C) (Oral)  Resp 22  SpO2 92% Physical Exam  Nursing note and vitals reviewed. Constitutional: He is oriented to person, place, and time. He appears well-developed and well-nourished. No distress.  Awake, alert, nontoxic  appearance  HENT:  Head: Atraumatic.  Eyes: Conjunctivae are normal. Right eye exhibits no discharge. Left eye exhibits no discharge.  Neck: Normal range of motion. Neck supple.  Cardiovascular: Normal rate, regular rhythm and intact distal pulses.   Pulmonary/Chest: Effort normal. No respiratory distress. He exhibits no tenderness.  Abdominal: Soft. There is no tenderness. There is no rebound.  Musculoskeletal: He exhibits no edema and no tenderness.  ROM appears intact, no obvious focal weakness  BLE: No palpable cords, erythema, edema, negative Homan Sign  Neurological: He is alert and oriented to person, place, and time.  Skin: Skin is warm and dry. No rash noted.  Psychiatric: He has a normal mood and affect.    ED Course  Procedures (including critical  care time)   Date: 02/22/2013  Rate: 88  Rhythm: normal sinus rhythm  QRS Axis: normal  Intervals: normal  ST/T Wave abnormalities: normal  Conduction Disutrbances: none  Narrative Interpretation:   Old EKG Reviewed: no prior for comparison  7:56 AM Patient here with chest discomfort concerning for ACS. He has a normal EKG and negative troponin initially. He is currently reporting a 4/10 chest pain. Will get aspirin, nitroglycerin, and morphine. We'll continue workup and will consult cardiology for further management.    8:42 AM Pt report some improvement after receiving SL nitro. Denies SOB at this time.  Low risk for PE however O2 remains in the low 90s while on 3L O2.  CXR shows mild left lower lobe atelectasis.  Pt does appears drowsy, but able to answer all questions appropriately.  My attending was made aware and evaluate pt.  Recommend D-dimer to r/o PE, and will also check ABG as pt has hx of sleep apnea and may be retaining CO2.  Will continue close monitor.    9:06 AM ABG with normal CO2 level and normal pH.  Will also check BNP.  Patient states his chest discomfort has fully resolved after receiving 3 doses of sublingual nitroglycerin and aspirin.   10:15 AM She has a negative d-dimer, normal proBNP, normal hemoglobin, and electrolytes are reassuring. He is currently chest pain-free.  Continue to remain drowsy but arousable.  No headache.  Will check O2 sats while ambulating.  Will consult for admission for chest pain rule out with obs.    10:25 AM i have consulted internal medicine-unassigned, Dr. Bosie Clos agrees to see pt in ER and will admit for further care.  Labs Review Labs Reviewed  BASIC METABOLIC PANEL - Abnormal; Notable for the following:    GFR calc non Af Amer 75 (*)    GFR calc Af Amer 87 (*)    All other components within normal limits  POCT I-STAT 3, BLOOD GAS (G3+) - Abnormal; Notable for the following:    pO2, Arterial 79.0 (*)    Acid-base deficit 3.0  (*)    All other components within normal limits  CBC  PRO B NATRIURETIC PEPTIDE  D-DIMER, QUANTITATIVE  POCT I-STAT TROPONIN I   Imaging Review Dg Chest 2 View  02/22/2013   *RADIOLOGY REPORT*  Clinical Data: Chest pain  CHEST - 2 VIEW  Comparison: 12/25/2012  Findings: Heart size and vascularity are normal.  Negative for heart failure.  Mild left lower lobe atelectasis.  Right lung is clear.  Negative for pleural effusion.  IMPRESSION: Mild left lower lobe atelectasis.   Original Report Authenticated By: Janeece Riggers, M.D.    MDM   1. Chest pain    BP 125/68  Pulse 67  Temp(Src) 97.9 F (36.6 C) (Oral)  Resp 17  SpO2 98%  I have reviewed nursing notes and vital signs. I personally reviewed the imaging tests through PACS system  I reviewed available ER/hospitalization records thought the EMR     Fayrene Helper, PA-C 02/22/13 1026

## 2013-02-22 NOTE — ED Notes (Signed)
PT a

## 2013-02-22 NOTE — Progress Notes (Signed)
Utilization review completed.  

## 2013-02-22 NOTE — ED Notes (Signed)
Dr Fonnie Jarvis at bedside.  Pt stats continue to decrease to 89 on 4L.  Pt ao x 4, but appears drowsy.

## 2013-02-22 NOTE — ED Notes (Signed)
Schooler with internal medicine at bedside.

## 2013-02-22 NOTE — ED Notes (Signed)
Patient transported to CT 

## 2013-02-22 NOTE — ED Notes (Signed)
Hx. Of vertigo; nausea with quick movements, hard to keep eyes without feeling dizzy; lasts for a moment.

## 2013-02-22 NOTE — Consult Note (Signed)
HPI: 52 year old male with past medical history of hypertension, COPD, sleep apnea for evaluation of chest pain. Patient had an episode of chest pain in 2010. Cardiac catheterization was apparently performed in [place] but he did not have stents placed. He has had intermittent chest pain since then. It typically occurs with "stress". It is not have exertional chest pain and it is not related to food. His chest pain typically lasts 2 minutes and resolves. It is substernal and radiates to the shoulder. He had a more severe episode today and there is associated nausea, shortness of breath and diaphoresis and he presented to the emergency room. He is presently pain-free. He denies dyspnea on exertion or exertional chest pain.  Medications Prior to Admission  Medication Sig Dispense Refill  . calcium carbonate (TUMS - DOSED IN MG ELEMENTAL CALCIUM) 500 MG chewable tablet Chew 1 tablet by mouth daily as needed for heartburn.      Marland Kitchen HYDROcodone-acetaminophen (NORCO) 7.5-325 MG per tablet Take 1 tablet by mouth every 4 (four) hours as needed for pain.       Marland Kitchen LORazepam (ATIVAN) 0.5 MG tablet Take 0.5 mg by mouth every 6 (six) hours as needed for anxiety.       . metoprolol (LOPRESSOR) 50 MG tablet Take 50 mg by mouth daily.      . Naproxen Sodium (ALEVE PO) Take 2 tablets by mouth daily as needed (headache).        No Known Allergies  Past Medical History  Diagnosis Date  . Hypertension   . Sleep apnea   . Traumatic brain injury     2013  . Cardiomyopathy   . Seizures   . COPD (chronic obstructive pulmonary disease)     Past Surgical History  Procedure Laterality Date  . Appendectomy    . Leg surgery    . Cardiac catheterization  11/2008    History   Social History  . Marital Status: Widowed    Spouse Name: N/A    Number of Children: N/A  . Years of Education: N/A   Occupational History  . [employer]   Social History Main Topics  . Smoking status: Current Every Day Smoker    . Smokeless tobacco: Not on file  . Alcohol Use: 8.4 oz/week    14 Cans of beer per week     Comment: will sometimes drink until he "losses count"  . Drug Use: No  . Sexual Activity: Not on file   Other Topics Concern  . Not on file   Social History Narrative  . No narrative on file    Family History  Problem Relation Age of Onset  . Heart failure Father   . Diabetes Mother   . Diabetes Brother   . Heart failure Paternal Grandfather   . Diabetes Father     ROS:  no fevers or chills, productive cough, hemoptysis, dysphasia, odynophagia, melena, hematochezia, dysuria, hematuria, rash, seizure activity, orthopnea, PND, pedal edema, claudication. Remaining systems are negative.  Physical Exam:   Blood pressure 130/84, pulse 53, temperature 97.6 F (36.4 C), temperature source Oral, resp. rate 18, SpO2 97.00%.  General:  Well developed/well nourished in NAD Skin warm/dry Patient not depressed No peripheral clubbing Back-normal HEENT-normal/normal eyelids Neck supple/normal carotid upstroke bilaterally; no bruits; no JVD; no thyromegaly chest - CTA/ normal expansion CV - RRR/normal S1 and S2; no murmurs, rubs or gallops;  PMI nondisplaced Abdomen -NT/ND, no HSM, no mass, + bowel sounds, no bruit 2+ femoral pulses,  no bruits Ext-no edema, chords, 2+ DP Neuro-grossly nonfocal  ECG sinus rhythm with no ST changes.  Results for orders placed during the hospital encounter of 02/22/13 (from the past 48 hour(s))  CBC     Status: None   Collection Time    02/22/13  5:54 AM      Result Value Range   WBC 7.2  4.0 - 10.5 K/uL   RBC 4.91  4.22 - 5.81 MIL/uL   Hemoglobin 16.6  13.0 - 17.0 g/dL   HCT 82.9  56.2 - 13.0 %   MCV 94.7  78.0 - 100.0 fL   MCH 33.8  26.0 - 34.0 pg   MCHC 35.7  30.0 - 36.0 g/dL   RDW 86.5  78.4 - 69.6 %   Platelets 178  150 - 400 K/uL  BASIC METABOLIC PANEL     Status: Abnormal   Collection Time    02/22/13  5:54 AM      Result Value Range    Sodium 136  135 - 145 mEq/L   Potassium 3.9  3.5 - 5.1 mEq/L   Chloride 101  96 - 112 mEq/L   CO2 19  19 - 32 mEq/L   Glucose, Bld 85  70 - 99 mg/dL   BUN 18  6 - 23 mg/dL   Creatinine, Ser 2.95  0.50 - 1.35 mg/dL   Calcium 28.4  8.4 - 13.2 mg/dL   GFR calc non Af Amer 75 (*) >90 mL/min   GFR calc Af Amer 87 (*) >90 mL/min   Comment: (NOTE)     The eGFR has been calculated using the CKD EPI equation.     This calculation has not been validated in all clinical situations.     eGFR's persistently <90 mL/min signify possible Chronic Kidney     Disease.  PRO B NATRIURETIC PEPTIDE     Status: None   Collection Time    02/22/13  5:54 AM      Result Value Range   Pro B Natriuretic peptide (BNP) 32.3  0 - 125 pg/mL  POCT I-STAT TROPONIN I     Status: None   Collection Time    02/22/13  6:05 AM      Result Value Range   Troponin i, poc 0.00  0.00 - 0.08 ng/mL   Comment 3            Comment: Due to the release kinetics of cTnI,     a negative result within the first hours     of the onset of symptoms does not rule out     myocardial infarction with certainty.     If myocardial infarction is still suspected,     repeat the test at appropriate intervals.  POCT I-STAT 3, BLOOD GAS (G3+)     Status: Abnormal   Collection Time    02/22/13  9:02 AM      Result Value Range   pH, Arterial 7.353  7.350 - 7.450   pCO2 arterial 39.4  35.0 - 45.0 mmHg   pO2, Arterial 79.0 (*) 80.0 - 100.0 mmHg   Bicarbonate 21.9  20.0 - 24.0 mEq/L   TCO2 23  0 - 100 mmol/L   O2 Saturation 95.0     Acid-base deficit 3.0 (*) 0.0 - 2.0 mmol/L   Collection site RADIAL, ALLEN'S TEST ACCEPTABLE     Drawn by RT     Sample type ARTERIAL    D-DIMER, QUANTITATIVE  Status: None   Collection Time    02/22/13  9:28 AM      Result Value Range   D-Dimer, Quant <0.27  0.00 - 0.48 ug/mL-FEU   Comment:            AT THE INHOUSE ESTABLISHED CUTOFF     VALUE OF 0.48 ug/mL FEU,     THIS ASSAY HAS BEEN DOCUMENTED      IN THE LITERATURE TO HAVE     A SENSITIVITY AND NEGATIVE     PREDICTIVE VALUE OF AT LEAST     98 TO 99%.  THE TEST RESULT     SHOULD BE CORRELATED WITH     AN ASSESSMENT OF THE CLINICAL     PROBABILITY OF DVT / VTE.  ETHANOL     Status: None   Collection Time    02/22/13 10:37 AM      Result Value Range   Alcohol, Ethyl (B) <11  0 - 11 mg/dL   Comment:            LOWEST DETECTABLE LIMIT FOR     SERUM ALCOHOL IS 11 mg/dL     FOR MEDICAL PURPOSES ONLY  CBC     Status: Abnormal   Collection Time    02/22/13  2:49 PM      Result Value Range   WBC 5.1  4.0 - 10.5 K/uL   RBC 4.66  4.22 - 5.81 MIL/uL   Hemoglobin 15.9  13.0 - 17.0 g/dL   HCT 40.9  81.1 - 91.4 %   MCV 95.7  78.0 - 100.0 fL   MCH 34.1 (*) 26.0 - 34.0 pg   MCHC 35.7  30.0 - 36.0 g/dL   RDW 78.2  95.6 - 21.3 %   Platelets 140 (*) 150 - 400 K/uL  TROPONIN I     Status: None   Collection Time    02/22/13  2:49 PM      Result Value Range   Troponin I <0.30  <0.30 ng/mL   Comment:            Due to the release kinetics of cTnI,     a negative result within the first hours     of the onset of symptoms does not rule out     myocardial infarction with certainty.     If myocardial infarction is still suspected,     repeat the test at appropriate intervals.    Dg Chest 2 View  02/22/2013   *RADIOLOGY REPORT*  Clinical Data: Chest pain  CHEST - 2 VIEW  Comparison: 12/25/2012  Findings: Heart size and vascularity are normal.  Negative for heart failure.  Mild left lower lobe atelectasis.  Right lung is clear.  Negative for pleural effusion.  IMPRESSION: Mild left lower lobe atelectasis.   Original Report Authenticated By: Janeece Riggers, M.D.    Assessment/Plan 1 chest pain-symptoms are extremely atypical. Electrocardiogram is normal. Agree with cycling enzymes. If negative he can have an outpatient functional study for risk stratification. 2 hypertension-continue present blood pressure medications and follow. 3 tobacco  abuse-patient counseled on discontinuing.  Olga Millers MD 02/22/2013, 4:02 PM

## 2013-02-23 DIAGNOSIS — I517 Cardiomegaly: Secondary | ICD-10-CM

## 2013-02-23 LAB — TROPONIN I: Troponin I: <0.3 ng/mL (ref <0.30)

## 2013-02-23 MED ORDER — ASPIRIN 81 MG PO TBEC: 81.0000 mg | DELAYED_RELEASE_TABLET | Freq: Every day | ORAL | Status: DC

## 2013-02-23 MED ORDER — CALCIUM CARBONATE ANTACID 500 MG PO CHEW: 1.0000 | CHEWABLE_TABLET | Freq: Every day | ORAL | Status: AC | PRN

## 2013-02-23 MED ORDER — METOPROLOL TARTRATE 50 MG PO TABS: 50.0000 mg | ORAL_TABLET | Freq: Every day | ORAL | Status: DC

## 2013-02-23 NOTE — Discharge Summary (Signed)
Name: Robert Buck MRN: [id number] DOB: 05/20/1961 52 y.o. PCP: Dr. Chestine Spore [facility], [place])   Date of Admission: 02/22/2013  5:44 AM Date of Discharge: 02/23/2013 Attending Physician: Jonah Blue  Discharge Diagnosis:  1. Chest pain 2. Hypertension 3. Alcohol and tobacco abuse  Discharge Medications:   Medication List         aspirin 81 MG EC tablet  Take 1 tablet (81 mg total) by mouth daily.     calcium carbonate 500 MG chewable tablet  Commonly known as:  TUMS - dosed in mg elemental calcium  Chew 1 tablet (200 mg of elemental calcium total) by mouth daily as needed for heartburn.     HYDROcodone-acetaminophen 7.5-325 MG per tablet  Commonly known as:  NORCO  Take 1 tablet by mouth every 4 (four) hours as needed for pain.     LORazepam 0.5 MG tablet  Commonly known as:  ATIVAN  Take 0.5 mg by mouth every 6 (six) hours as needed for anxiety.     metoprolol 50 MG tablet  Commonly known as:  LOPRESSOR  Take 1 tablet (50 mg total) by mouth daily.        Disposition and follow-up:   Robert Buck was discharged from [facility] in stable condition.  At the hospital follow up visit please address:  1.  Chest pain recurrance  2.  Labs / imaging needed at time of follow-up: Outpatient stress testing (to be done by [facility])  3.  Pending labs/ test needing follow-up: None   Follow-up Appointments: Follow-up Information   Call Quintella Reichert, MD. [facility] cardiology will call you about your appointment next week)    Specialty:  Cardiology   Contact information:   [address] [place] [address] [phone]       Schedule an appointment as soon as possible for a visit with Dr. Loren Racer.   Contact information:   [place], [place]      Discharge Instructions: Discharge Orders   Future Orders Complete By Expires   Diet - low sodium heart healthy  As directed    Increase activity slowly  As  directed       Consultations: Treatment Team:  Rounding L[facility]cardiology, MD  Procedures Performed:  Dg Chest 2 View  02/22/2013   *RADIOLOGY REPORT*  Clinical Data: Chest pain  CHEST - 2 VIEW  Comparison: 12/25/2012  Findings: Heart size and vascularity are normal.  Negative for heart failure.  Mild left lower lobe atelectasis.  Right lung is clear.  Negative for pleural effusion.  IMPRESSION: Mild left lower lobe atelectasis.   Original Report Authenticated By: Janeece Riggers, M.D.   Dg Cervical Spine Complete  02/12/2013   *RADIOLOGY REPORT*  Clinical Data: Syncope, fall  CERVICAL SPINE - COMPLETE 4+ VIEW  Comparison: Cervical spine CT dated 12/25/2012  Findings: Cervical spine is visualized to C7-T1 on the lateral view.  Reversal of normal cervical lordosis.  No evidence of fracture or dislocation.  Vertebral body heights are maintained.  Dens appears intact.  Lateral masses of C1 are symmetric.  No prevertebral soft tissue swelling.  Mild multilevel degenerative changes of the lower cervical spine.  Bilateral neural foramina are patent.  Visualized lung apices are clear.  IMPRESSION: No fracture or dislocation is seen.  Mild multilevel degenerative changes.   Original Report Authenticated By: Charline Bills, M.D.   Ct Head Wo Contrast  02/12/2013   *RADIOLOGY REPORT*  Clinical Data: Loss  of consciousness; recent trauma  CT HEAD WITHOUT CONTRAST  Technique:  Contiguous axial images were obtained from the base of the skull through the vertex without contrast. There was obtained within 24 hours of patient arrival at the emergency department.  Comparison: January 03, 2013  Findings: There is mild diffuse atrophy.  There is no mass, hemorrhage, extra-axial fluid collection, or midline shift.  There is minimal small vessel disease in the centra semiovale bilaterally.  Gray-white compartments are otherwise normal.  There is a right superior occipital scalp hematoma.  There is a stable linear fracture,  nondisplaced, in the more inferior right occipital bone.  No new fracture is appreciable on this study. Mastoid air cells bilaterally are clear.  IMPRESSION: Stable nondisplaced right occipital bone fracture.  There is a new scalp hematoma in the right occipital region.  There is atrophy with minimal small vessel disease.  There is no intracranial hemorrhage or extra-axial fluid.  No acute appearing infarct.   Original Report Authenticated By: Bretta Bang, M.D.    2D Echo:  Study Conclusions  - Left ventricle: The cavity size was normal. Wall thickness was increased in a pattern of mild LVH. The estimated ejection fraction was 60%. Wall motion was normal; there were no regional wall motion abnormalities. - Right ventricle: The cavity size was normal. Systolic function was normal. Transthoracic echocardiography. M-mode, complete 2D, spectral Doppler, and color Doppler. Height: Height: 177.8cm. Height: 70in. Weight: Weight: 98.9kg. Weight: 217.5lb. Body mass index: BMI: 31.3kg/m^2. Body surface area: BSA: 2.45m^2. Blood pressure: 106/63. Patient status: Inpatient. Location: Bedside.   Cardiac Cath: None  Admission HPI:  Robert Buck is a 52 yo male with history significant for hypertension, obstructive sleep apnea, GERD, tobacco abuse, and periodic alcohol abuse who presents with complaints of left sided chest pressure with abrupt onset on morning of presentation to the ED. He states that he woke up not "feeling quite right" but without chest pain and as he was preparing for work he suddenly felt mid-chest pressure which resolved spontaneously with 1-2 minutes. Within 15 minutes more intense chest pain "brought me to my knees" and was associated with diaphoresis, shortness of breath, nausea, and radiation to his left neck and arm. Not associated with change in position or inspiration. Pain resolved ED after nitroglycerin and aspirin. He states that this pain felt similar to his chest pain  experienced in 2010 whereby he subsequently had cardiac catherization demonstrating non-obstructive CAD. He has not had chest pain since that time. He has been without his blood pressure and GERD medications for ~2-3 weeks.   Hospital Course by problem list:   1. Chest pain - Patient presented with chest pain concerning for ACS. It resolved in the ED s/p sublingual NG and ASA. Likely unstable angina vs. GERD in the setting of 2-3 weeks medication noncompliance. No concerning EKG changes on admission. Trop negative x3. D-Dimer negative. Pro-BNP normal. Lipid panel was wnl (TC 146, LDL 82). TIMI score of 2 indicating 8% risk. 10-yr ASCVD risk 4.2%. Echo showed LV wall thickness increased in a pattern of mild LVH, ejection fraction 60%, wall motion was normal, there were no regional wall motion abnormalities. Cardiology recommended an outpatient functional study for risk stratification, which he will have done here in [facility] with [facility] before returning home to [facility]. We began daily ASA 81mg  on discharge.  2. HTN - His blood pressure was stable on his home dose of metoprolol.  3. Alcohol and tobacco abuse - EtOH was negative  here. He was placed on CIWA protocol but exhibited no signs or symptoms of alcohol withdrawal. Counseling on alcohol and smoking cessation were provided.   Discharge Vitals:   BP 106/63  Pulse 56  Temp(Src) 97.6 F (36.4 C) (Oral)  Resp 18  Ht 5\' 10"  (1.778 m)  Wt 218 lb 11.2 oz (99.202 kg)  BMI 31.38 kg/m2  SpO2 99%  Discharge Labs:   Results for orders placed during the hospital encounter of 02/22/13 (from the past 24 hour(s))  LIPID PANEL     Status: None   Collection Time    02/22/13  2:49 PM      Result Value Range   Cholesterol 146  0 - 200 mg/dL   Triglycerides 57  <119 mg/dL   HDL 53  >14 mg/dL   Total CHOL/HDL Ratio 2.8     VLDL 11  0 - 40 mg/dL   LDL Cholesterol 82  0 - 99 mg/dL  CBC     Status: Abnormal   Collection Time     02/22/13  2:49 PM      Result Value Range   WBC 5.1  4.0 - 10.5 K/uL   RBC 4.66  4.22 - 5.81 MIL/uL   Hemoglobin 15.9  13.0 - 17.0 g/dL   HCT 78.2  95.6 - 21.3 %   MCV 95.7  78.0 - 100.0 fL   MCH 34.1 (*) 26.0 - 34.0 pg   MCHC 35.7  30.0 - 36.0 g/dL   RDW 08.6  57.8 - 46.9 %   Platelets 140 (*) 150 - 400 K/uL  CREATININE, SERUM     Status: None   Collection Time    02/22/13  2:49 PM      Result Value Range   Creatinine, Ser 0.93  0.50 - 1.35 mg/dL   GFR calc non Af Amer >90  >90 mL/min   GFR calc Af Amer >90  >90 mL/min  TROPONIN I     Status: None   Collection Time    02/22/13  2:49 PM      Result Value Range   Troponin I <0.30  <0.30 ng/mL  TROPONIN I     Status: None   Collection Time    02/22/13  9:10 PM      Result Value Range   Troponin I <0.30  <0.30 ng/mL  TROPONIN I     Status: None   Collection Time    02/23/13 12:32 AM      Result Value Range   Troponin I <0.30  <0.30 ng/mL    Signed: Vivi Barrack, MD 02/24/2013, 10:04 PM   Time Spent on Discharge: 30 minutes Services Ordered on Discharge: None Equipment Ordered on Discharge: None

## 2013-02-23 NOTE — H&P (Signed)
INTERNAL MEDICINE TEACHING SERVICE Attending Admission Note  Date: 02/23/2013  Patient name: Robert Buck  Medical record number: [id number]  Date of birth: 26-May-1961    I have seen and evaluated Robert Buck and discussed their care with the Residency Team.   52 yr old male w/ hx OSA, HTN, tobacco abuse, hx alcohol abuse, GERD, presented with CP.  Descriptions of his CP make this atypical in nature.  EKG without ST changes. He has ruled out for ACS.  Outpatient stress testing recommended by cardiology.  He is CP free at this time.  I would not change his home medication regimen.  Counseled on tobacco cessation.  Medically stable to D/C home.  Jonah Blue, DO, FACP Faculty [facility] Internal Medicine Residency Program 02/23/2013, 11:49 AM

## 2013-02-23 NOTE — Progress Notes (Signed)
Subjective: Patient seen at the bedside. He feels well this morning. Denies chest pain, SOB, nausea, vomiting, diarrhea. He will only be in [place] until Sept. 25 but is willing to see cardiology beforehand for outpatient risk stratification.  Objective: Vital signs in last 24 hours: Filed Vitals:   02/22/13 2000 02/22/13 2138 02/23/13 0147 02/23/13 0759  BP: 120/76  115/62 106/63  Pulse: 52 56 74 56  Temp: 98.3 F (36.8 C)  97.8 F (36.6 C) 97.6 F (36.4 C)  TempSrc: Oral  Oral Oral  Resp: 18  18 18   Height: 5\' 10"  (1.778 m)     Weight: 218 lb 11.2 oz (99.202 kg)     SpO2: 95%  96% 99%   Weight change:   Intake/Output Summary (Last 24 hours) at 02/23/13 1255 Last data filed at 02/23/13 0656  Gross per 24 hour  Intake    960 ml  Output    625 ml  Net    335 ml   Physical Exam: General: Well-developed, well-nourished, White male, in no acute distress; Head: Normocephalic, atraumatic, small well healed laceration to top of scalp, PERRLA, EOMI, Moist mucous membranes Neck: supple, no masses, no carotid Bruits, no JVD appreciated. Lungs: Normal respiratory effort. Clear to auscultation bilaterally from apices to bases without crackles or wheezes appreciated. Heart: normal rate, regular rhythm, normal S1 and S2, no gallop, murmur, or rubs appreciated. Abdomen: BS normoactive. Soft, Nondistended, non-tender. No masses or organomegaly appreciated. Extremities: No pretibial edema, distal pulses intact Neurologic: grossly non-focal, alert and oriented x3, appropriate and cooperative throughout examination.  Lab Results: Basic Metabolic Panel:  Recent Labs Lab 02/22/13 0554 02/22/13 1449  NA 136  --   K 3.9  --   CL 101  --   CO2 19  --   GLUCOSE 85  --   BUN 18  --   CREATININE 1.11 0.93  CALCIUM 10.1  --    CBC:  Recent Labs Lab 02/22/13 0554 02/22/13 1449  WBC 7.2 5.1  HGB 16.6 15.9  HCT 46.5 44.6  MCV 94.7 95.7  PLT 178 140*   Cardiac  Enzymes:  Recent Labs Lab 02/22/13 1449 02/22/13 2110 02/23/13 0032  TROPONINI <0.30 <0.30 <0.30   BNP:  Recent Labs Lab 02/22/13 0554  PROBNP 32.3   D-Dimer:  Recent Labs Lab 02/22/13 0928  DDIMER <0.27   Fasting Lipid Panel:  Recent Labs Lab 02/22/13 1449  CHOL 146  HDL 53  LDLCALC 82  TRIG 57  CHOLHDL 2.8   Alcohol Level:  Recent Labs Lab 02/22/13 1037  ETH <11   Studies/Results: Dg Chest 2 View  02/22/2013   *RADIOLOGY REPORT*  Clinical Data: Chest pain  CHEST - 2 VIEW  Comparison: 12/25/2012  Findings: Heart size and vascularity are normal.  Negative for heart failure.  Mild left lower lobe atelectasis.  Right lung is clear.  Negative for pleural effusion.  IMPRESSION: Mild left lower lobe atelectasis.   Original Report Authenticated By: Janeece Riggers, M.D.   Medications: I have reviewed the patient's current medications. Scheduled Meds: . aspirin EC  325 mg Oral Daily  . folic acid  1 mg Oral Daily  . heparin subcutaneous  5,000 Units Subcutaneous Q8H  . metoprolol tartrate  25 mg Oral BID  . multivitamin with minerals  1 tablet Oral Daily  . thiamine  100 mg Oral Daily   Continuous Infusions:  PRN Meds:.acetaminophen, albuterol, gi cocktail, HYDROcodone-acetaminophen, ipratropium, LORazepam, LORazepam, morphine injection, ondansetron (ZOFRAN) IV  Assessment/Plan: Robert Buck is a 52 yo male with history significant for hypertension, obstructive sleep apnea, GERD, tobacco abuse, and periodic alcohol abuse who presents with complaints of left sided chest pain.  #Left sided chest pain - Resolved. Likely unstable angina vs. GERD in the setting of 2-3 weeks medication noncompliance. No concerning EKG changes. Trop negative x3. D-Dimer negative. Pro-BNP normal. TIMI score of 2 indicating 8% risk. 10-yr ASCVD risk 4.2%. Echo shows LV wall thickness increased in a pattern of mild LVH, ejection fraction 60%, wall motion was normal, there were no regional wall  motion abnormalities. - Appreciate cardiology recs - will arrange outpatient functional study for risk stratification here in [facility] - ASA daily - Medically stable for discharge  #HTN - Stable. - Continue home metoprolol  #Alcohol abuse - EtOH negative. No signs or symptoms of alcohol withdrawal. - Continue CIWA protocol  #COPD - Stable. - Continue duonebs prn  #DVT PPX - subq heparin  Dispo: Disposition is deferred at this time, awaiting improvement of current medical problems.  Anticipated discharge in approximately 1-3 day(s).   The patient does have a current PCP (Pcp Not In System) and does need an [facility] hospital follow-up appointment after discharge.  The patient does not have transportation limitations that hinder transportation to clinic appointments.  .Services Needed at time of discharge: Y = Yes, Blank = No PT:   OT:   RN:   Equipment:   Other:     LOS: 1 day   Vivi Barrack, MD 02/23/2013, 12:55 PM

## 2013-02-23 NOTE — Progress Notes (Signed)
  Echocardiogram 2D Echocardiogram has been performed.  Georgian Co 02/23/2013, 9:37 AM

## 2013-02-23 NOTE — Progress Notes (Signed)
SUBJECTIVE:  No further CP  OBJECTIVE:   Vitals:   Filed Vitals:   02/22/13 2000 02/22/13 2138 02/23/13 0147 02/23/13 0759  BP: 120/76  115/62 106/63  Pulse: 52 56 74 56  Temp: 98.3 F (36.8 C)  97.8 F (36.6 C) 97.6 F (36.4 C)  TempSrc: Oral  Oral Oral  Resp: 18  18 18   Height: 5\' 10"  (1.778 m)     Weight: 99.202 kg (218 lb 11.2 oz)     SpO2: 95%  96% 99%   I&O's:   Intake/Output Summary (Last 24 hours) at 02/23/13 0942 Last data filed at 02/23/13 0656  Gross per 24 hour  Intake    960 ml  Output    625 ml  Net    335 ml   TELEMETRY: Reviewed telemetry pt in NSR:     PHYSICAL EXAM General: Well developed, well nourished, in no acute distress Head: Eyes PERRLA, No xanthomas.   Normal cephalic and atramatic  Lungs:   Clear bilaterally to auscultation and percussion. Heart:   HRRR S1 S2 Pulses are 2+ & equal. Abdomen: Bowel sounds are positive, abdomen soft and non-tender without masses  Extremities:   No clubbing, cyanosis or edema.  DP +1 Neuro: Alert and oriented X 3. Psych:  Good affect, responds appropriately   LABS: Basic Metabolic Panel:  Recent Labs  95/28/41 0554 02/22/13 1449  NA 136  --   K 3.9  --   CL 101  --   CO2 19  --   GLUCOSE 85  --   BUN 18  --   CREATININE 1.11 0.93  CALCIUM 10.1  --    Liver Function Tests: No results found for this basename: AST, ALT, ALKPHOS, BILITOT, PROT, ALBUMIN,  in the last 72 hours No results found for this basename: LIPASE, AMYLASE,  in the last 72 hours CBC:  Recent Labs  02/22/13 0554 02/22/13 1449  WBC 7.2 5.1  HGB 16.6 15.9  HCT 46.5 44.6  MCV 94.7 95.7  PLT 178 140*   Cardiac Enzymes:  Recent Labs  02/22/13 1449 02/22/13 2110 02/23/13 0032  TROPONINI <0.30 <0.30 <0.30   BNP: No components found with this basename: POCBNP,  D-Dimer:  Recent Labs  02/22/13 0928  DDIMER <0.27   Hemoglobin A1C: No results found for this basename: HGBA1C,  in the last 72 hours Fasting Lipid  Panel:  Recent Labs  02/22/13 1449  CHOL 146  HDL 53  LDLCALC 82  TRIG 57  CHOLHDL 2.8   Thyroid Function Tests: No results found for this basename: TSH, T4TOTAL, FREET3, T3FREE, THYROIDAB,  in the last 72 hours Anemia Panel: No results found for this basename: VITAMINB12, FOLATE, FERRITIN, TIBC, IRON, RETICCTPCT,  in the last 72 hours Coag Panel:   No results found for this basename: INR, PROTIME    RADIOLOGY: Dg Chest 2 View  02/22/2013   *RADIOLOGY REPORT*  Clinical Data: Chest pain  CHEST - 2 VIEW  Comparison: 12/25/2012  Findings: Heart size and vascularity are normal.  Negative for heart failure.  Mild left lower lobe atelectasis.  Right lung is clear.  Negative for pleural effusion.  IMPRESSION: Mild left lower lobe atelectasis.   Original Report Authenticated By: Janeece Riggers, M.D.   Dg Cervical Spine Complete  02/12/2013   *RADIOLOGY REPORT*  Clinical Data: Syncope, fall  CERVICAL SPINE - COMPLETE 4+ VIEW  Comparison: Cervical spine CT dated 12/25/2012  Findings: Cervical spine is visualized to C7-T1 on the lateral  view.  Reversal of normal cervical lordosis.  No evidence of fracture or dislocation.  Vertebral body heights are maintained.  Dens appears intact.  Lateral masses of C1 are symmetric.  No prevertebral soft tissue swelling.  Mild multilevel degenerative changes of the lower cervical spine.  Bilateral neural foramina are patent.  Visualized lung apices are clear.  IMPRESSION: No fracture or dislocation is seen.  Mild multilevel degenerative changes.   Original Report Authenticated By: Charline Bills, M.D.   Ct Head Wo Contrast  02/12/2013   *RADIOLOGY REPORT*  Clinical Data: Loss of consciousness; recent trauma  CT HEAD WITHOUT CONTRAST  Technique:  Contiguous axial images were obtained from the base of the skull through the vertex without contrast. There was obtained within 24 hours of patient arrival at the emergency department.  Comparison: January 03, 2013  Findings:  There is mild diffuse atrophy.  There is no mass, hemorrhage, extra-axial fluid collection, or midline shift.  There is minimal small vessel disease in the centra semiovale bilaterally.  Gray-white compartments are otherwise normal.  There is a right superior occipital scalp hematoma.  There is a stable linear fracture, nondisplaced, in the more inferior right occipital bone.  No new fracture is appreciable on this study. Mastoid air cells bilaterally are clear.  IMPRESSION: Stable nondisplaced right occipital bone fracture.  There is a new scalp hematoma in the right occipital region.  There is atrophy with minimal small vessel disease.  There is no intracranial hemorrhage or extra-axial fluid.  No acute appearing infarct.   Original Report Authenticated By: Bretta Bang, M.D.   Assessment/Plan  1 chest pain-symptoms are extremely atypical. Electrocardiogram is normal. Cardiac enzymes negative x 3 so he can have an outpatient functional study for risk stratification. I will have the [facility] PA schedule outpt nuclear study this week since he will only be in [place] this week. 2 hypertension-continue present blood pressure medications and follow.  3 tobacco abuse-patient counseled on discontinuing     Quintella Reichert, MD  02/23/2013  9:42 AM

## 2013-02-24 ENCOUNTER — Other Ambulatory Visit: Payer: Self-pay

## 2013-02-24 ENCOUNTER — Emergency Department (HOSPITAL_COMMUNITY): Payer: Medicaid Other

## 2013-02-24 ENCOUNTER — Emergency Department (HOSPITAL_COMMUNITY)
Admission: EM | Admit: 2013-02-24 | Discharge: 2013-02-24 | Disposition: A | Discharge status: Home / Self Care | Payer: Medicaid Other | Attending: Emergency Medicine | Admitting: Emergency Medicine

## 2013-02-24 ENCOUNTER — Encounter (HOSPITAL_COMMUNITY): Payer: Self-pay | Admitting: *Deleted

## 2013-02-24 DIAGNOSIS — R05 Cough: Secondary | ICD-10-CM | POA: Insufficient documentation

## 2013-02-24 DIAGNOSIS — Z87820 Personal history of traumatic brain injury: Secondary | ICD-10-CM | POA: Insufficient documentation

## 2013-02-24 DIAGNOSIS — I10 Essential (primary) hypertension: Secondary | ICD-10-CM | POA: Insufficient documentation

## 2013-02-24 DIAGNOSIS — R55 Syncope and collapse: Secondary | ICD-10-CM

## 2013-02-24 DIAGNOSIS — Z8669 Personal history of other diseases of the nervous system and sense organs: Secondary | ICD-10-CM | POA: Insufficient documentation

## 2013-02-24 DIAGNOSIS — J441 Chronic obstructive pulmonary disease with (acute) exacerbation: Secondary | ICD-10-CM | POA: Insufficient documentation

## 2013-02-24 DIAGNOSIS — F17200 Nicotine dependence, unspecified, uncomplicated: Secondary | ICD-10-CM | POA: Insufficient documentation

## 2013-02-24 DIAGNOSIS — R42 Dizziness and giddiness: Secondary | ICD-10-CM | POA: Insufficient documentation

## 2013-02-24 DIAGNOSIS — Z79899 Other long term (current) drug therapy: Secondary | ICD-10-CM | POA: Insufficient documentation

## 2013-02-24 DIAGNOSIS — R079 Chest pain, unspecified: Secondary | ICD-10-CM | POA: Insufficient documentation

## 2013-02-24 DIAGNOSIS — Z951 Presence of aortocoronary bypass graft: Secondary | ICD-10-CM | POA: Insufficient documentation

## 2013-02-24 DIAGNOSIS — Z7982 Long term (current) use of aspirin: Secondary | ICD-10-CM | POA: Insufficient documentation

## 2013-02-24 DIAGNOSIS — R059 Cough, unspecified: Secondary | ICD-10-CM | POA: Insufficient documentation

## 2013-02-24 LAB — BASIC METABOLIC PANEL
GFR calc Af Amer: >90 mL/min (ref >90)
GFR calc non Af Amer: >90 mL/min (ref >90)
Potassium: 4.1 mEq/L (ref 3.5–5.1)
Sodium: 139 mEq/L (ref 135–145)

## 2013-02-24 LAB — CBC
MCHC: 34.6 g/dL (ref 30.0–36.0)
RDW: 13.9 % (ref 11.5–15.5)
WBC: 6.7 10*3/uL (ref 4.0–10.5)

## 2013-02-24 LAB — MAGNESIUM: Magnesium: 2.3 mg/dL (ref 1.5–2.5)

## 2013-02-24 LAB — POCT I-STAT TROPONIN I: Troponin i, poc: 0 ng/mL (ref 0.00–0.08)

## 2013-02-24 MED ORDER — MECLIZINE HCL 50 MG PO TABS: 50.0000 mg | ORAL_TABLET | Freq: Three times a day (TID) | ORAL | Status: DC | PRN

## 2013-02-24 MED ORDER — SODIUM CHLORIDE 0.9 % IV BOLUS (SEPSIS)
1000.0000 mL | Freq: Once | INTRAVENOUS | Status: AC
  Administered 2013-02-24: 1000 mL via INTRAVENOUS

## 2013-02-24 MED ORDER — ONDANSETRON HCL 4 MG/2ML IJ SOLN
4.0000 mg | Freq: Once | INTRAMUSCULAR | Status: AC
  Administered 2013-02-24: 4 mg via INTRAVENOUS
  Filled 2013-02-24: qty 2

## 2013-02-24 MED ORDER — MORPHINE SULFATE 4 MG/ML IJ SOLN
4.0000 mg | Freq: Once | INTRAMUSCULAR | Status: AC
  Administered 2013-02-24: 4 mg via INTRAVENOUS
  Filled 2013-02-24: qty 1

## 2013-02-24 MED ORDER — LORAZEPAM 2 MG/ML IJ SOLN
1.0000 mg | Freq: Once | INTRAMUSCULAR | Status: AC
  Administered 2013-02-24: 1 mg via INTRAVENOUS
  Filled 2013-02-24: qty 1

## 2013-02-24 MED ORDER — HYDROCODONE-ACETAMINOPHEN 5-325 MG PO TABS: 1.0000 | ORAL_TABLET | ORAL | Status: DC | PRN

## 2013-02-24 NOTE — ED Notes (Signed)
Pt reports chest pain with dizziness and nausea. Similar symptoms in the past

## 2013-02-24 NOTE — ED Notes (Signed)
Bed: WA14 Expected date:  Expected time:  Means of arrival:  Comments: hold 

## 2013-02-24 NOTE — ED Notes (Signed)
Re-stick for troponin at 330

## 2013-02-24 NOTE — ED Notes (Signed)
Pt escorted to discharge window. Pt verbalized understanding discharge instructions. In no acute distress.  

## 2013-02-24 NOTE — ED Provider Notes (Addendum)
TIME SEEN: 9:01 AM  CHIEF COMPLAINT: Chest pain, vertigo and nausea  HPI: Patient is a 52 y.o. 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 Patient reports that he has had vertigo with syncopal episodes since his injury in 2013. He reports they're becoming more frequent. He also reports frequent cough syncope. He also reports he's been having chest pressure without radiation and very mild shortness of breath has been intermittent over the last several weeks. He states his episode today started one hour prior to arrival. He denies a prior history of MI or stents. His last stress test was many years ago. Patient was recently seen in the emergency department several times for the same and has had negative cardiac labs, d-dimer and head CT. Patient was admitted to the hospital 2 days ago and discharged yesterday with plans to followup with cardiology for outpatient stress test. Patient has not yet scheduled his stress test. He reports he is from [facility] and is here visiting his father who recently had cardiac bypass. He denies any syncopal episodes that are not precipitated by vertigo or coughing.  ROS: See HPI Constitutional: no fever  Eyes: no drainage  ENT: no runny nose   Cardiovascular:   chest pain  Resp: SOB  GI: no vomiting GU: no dysuria Integumentary: no rash  Allergy: no hives  Musculoskeletal: no leg swelling  Neurological: no slurred speech ROS otherwise negative  PAST MEDICAL HISTORY/PAST SURGICAL HISTORY:  Past Medical History  Diagnosis Date  . Hypertension   . Sleep apnea   . Traumatic brain injury     2013  . Cardiomyopathy   . Seizures   . COPD (chronic obstructive pulmonary disease)     MEDICATIONS:  Prior to Admission medications   Medication Sig Start Date End Date Taking? Authorizing Provider  aspirin 81 MG EC tablet Take 1 tablet (81 mg total) by mouth  daily. 02/23/13   Vivi Barrack, MD  calcium carbonate (TUMS - DOSED IN MG ELEMENTAL CALCIUM) 500 MG chewable tablet Chew 1 tablet (200 mg of elemental calcium total) by mouth daily as needed for heartburn. 02/23/13   Vivi Barrack, MD  HYDROcodone-acetaminophen (NORCO) 7.5-325 MG per tablet Take 1 tablet by mouth every 4 (four) hours as needed for pain.     Historical Provider, MD  LORazepam (ATIVAN) 0.5 MG tablet Take 0.5 mg by mouth every 6 (six) hours as needed for anxiety.     Historical Provider, MD  metoprolol (LOPRESSOR) 50 MG tablet Take 1 tablet (50 mg total) by mouth daily. 02/23/13   Vivi Barrack, MD  Naproxen Sodium (ALEVE PO) Take 2 tablets by mouth daily as needed (headache).    Historical Provider, MD    ALLERGIES:  No Known Allergies  SOCIAL HISTORY:  History  Substance Use Topics  . Smoking status: Current Every Day Smoker  . Smokeless tobacco: Not on file  . Alcohol Use: 8.4 oz/week    14 Cans of beer per week     Comment: will sometimes drink until he "losses count"    FAMILY HISTORY: Family History  Problem Relation Age of Onset  . Heart failure Father   . Diabetes Mother   . Diabetes Brother   . Heart failure Paternal Grandfather   . Diabetes Father    no history of premature CAD  EXAM: There were no vitals taken for this visit. CONSTITUTIONAL: Alert and oriented and  responds appropriately to questions. Well-appearing; well-nourished HEAD: Normocephalic EYES: Conjunctivae clear, PERRL ENT: normal nose; no rhinorrhea; moist mucous membranes; pharynx without lesions noted NECK: Supple, no meningismus, no LAD  CARD: RRR; S1 and S2 appreciated; no murmurs, no clicks, no rubs, no gallops RESP: Normal chest excursion without splinting or tachypnea; breath sounds clear and equal bilaterally; no wheezes, no rhonchi, no rales,  ABD/GI: Normal bowel sounds; non-distended; soft, non-tender, no rebound, no guarding BACK:  The back appears normal and is non-tender to  palpation, there is no CVA tenderness EXT: Normal ROM in all joints; non-tender to palpation; no edema; normal capillary refill; no cyanosis    SKIN: Normal color for age and race; warm NEURO: Moves all extremities equally, strength 5/5 in all 4 extremities, cranial nerves II through XII intact, sensation to light touch intact diffusely, no dysmetria to finger-nose testing, mild nystagmus and vertiginous symptoms when looking to the right PSYCH: The patient's mood and manner are appropriate. Grooming and personal hygiene are appropriate.  MEDICAL DECISION MAKING: Patient with vertigo and syncope since his traumatic brain injury in 2013. He is neurologically intact on exam. No recent history of new head injury. No infectious symptoms. He is on anticoagulation. He's recently had a head CT which was negative for acute changes. Patient is also complaining of chest pain or shortness of breath. He was recently admitted and ruled out for acute cardiac injury. He is set up for outpatient stress test. Discussed with patient he would like outpatient followup rather than repeat admission. We'll obtain 3 sets of cardiac labs, chest x-ray and give morphine and Zofran for symptom control.  I do not feel patient is having a dissection or PE as the cause of his pain given he has normal mediastinum on chest x-ray and recently had a negative d-dimer.  Pt has had recent normal echo during last admission.   Date: 02/24/2013 9:02 Am  Rate: 81 Rhythm: normal sinus rhythm  QRS Axis: normal  Intervals: normal  ST/T Wave abnormalities: normal  Conduction Disutrbances: none  Narrative Interpretation: unremarkable; no ischemic changes    ED PROGRESS: Patient's first set of cardiac labs are negative. Chest x-ray clear. He is complaining of left shoulder pain currently. He states he also feels very anxious. Repeat EKG is unchanged. Repeat troponin 6 hours after the onset of the event.   Date: 02/24/2013 12:16 PM  Rate:  62  Rhythm: normal sinus rhythm  QRS Axis: normal  Intervals: normal  ST/T Wave abnormalities: normal  Conduction Disutrbances: none  Narrative Interpretation: unremarkable; no ischemic changes, unchanged compared to prior   3:00 PM  Pt is still hemodynamically stable and asymptomatic. Discussed with patient at length that I recommend he followup with [facility] cardiology as he was instructed to do by the hospitalist upon discharge yesterday. Discussed with him that he will need an outpatient stress test. Given strict return precautions. His second troponin that was 6 hours after onset of his chest pain is negative. Patient verbalizes understanding is comfortable with this plan.     Layla Maw Saja Bartolini, DO 02/24/13 1455  Indiyah Paone N Tiwanda Threats, DO 02/24/13 1511

## 2013-02-25 ENCOUNTER — Encounter (HOSPITAL_COMMUNITY): Payer: Self-pay | Admitting: Emergency Medicine

## 2013-02-25 ENCOUNTER — Emergency Department (HOSPITAL_COMMUNITY)
Admission: EM | Admit: 2013-02-25 | Discharge: 2013-02-25 | Disposition: A | Discharge status: Home / Self Care | Payer: Medicaid Other | Attending: Emergency Medicine | Admitting: Emergency Medicine

## 2013-02-25 ENCOUNTER — Emergency Department (HOSPITAL_COMMUNITY): Payer: Medicaid Other

## 2013-02-25 DIAGNOSIS — I10 Essential (primary) hypertension: Secondary | ICD-10-CM | POA: Insufficient documentation

## 2013-02-25 DIAGNOSIS — Z8672 Personal history of thrombophlebitis: Secondary | ICD-10-CM | POA: Insufficient documentation

## 2013-02-25 DIAGNOSIS — Z7982 Long term (current) use of aspirin: Secondary | ICD-10-CM | POA: Insufficient documentation

## 2013-02-25 DIAGNOSIS — J449 Chronic obstructive pulmonary disease, unspecified: Secondary | ICD-10-CM | POA: Insufficient documentation

## 2013-02-25 DIAGNOSIS — I428 Other cardiomyopathies: Secondary | ICD-10-CM | POA: Insufficient documentation

## 2013-02-25 DIAGNOSIS — Z79899 Other long term (current) drug therapy: Secondary | ICD-10-CM | POA: Insufficient documentation

## 2013-02-25 DIAGNOSIS — G40909 Epilepsy, unspecified, not intractable, without status epilepticus: Secondary | ICD-10-CM

## 2013-02-25 DIAGNOSIS — J4489 Other specified chronic obstructive pulmonary disease: Secondary | ICD-10-CM | POA: Insufficient documentation

## 2013-02-25 DIAGNOSIS — G4730 Sleep apnea, unspecified: Secondary | ICD-10-CM | POA: Insufficient documentation

## 2013-02-25 DIAGNOSIS — F17200 Nicotine dependence, unspecified, uncomplicated: Secondary | ICD-10-CM | POA: Insufficient documentation

## 2013-02-25 LAB — CBC WITH DIFFERENTIAL/PLATELET
Basophils Absolute: 0 10*3/uL (ref 0.0–0.1)
Basophils Relative: 0 % (ref 0–1)
Eosinophils Absolute: 0 10*3/uL (ref 0.0–0.7)
Eosinophils Relative: 0 % (ref 0–5)
Lymphocytes Relative: 16 % (ref 12–46)
MCH: 33.2 pg (ref 26.0–34.0)
MCHC: 34.7 g/dL (ref 30.0–36.0)
MCV: 95.7 fL (ref 78.0–100.0)
Platelets: 141 10*3/uL — ABNORMAL LOW (ref 150–400)
RDW: 14 % (ref 11.5–15.5)
WBC: 6.9 10*3/uL (ref 4.0–10.5)

## 2013-02-25 LAB — RAPID URINE DRUG SCREEN, HOSP PERFORMED: Opiates: NOT DETECTED

## 2013-02-25 LAB — POCT I-STAT, CHEM 8
Calcium, Ion: 1.18 mmol/L (ref 1.12–1.23)
Chloride: 104 mEq/L (ref 96–112)
Creatinine, Ser: 1.1 mg/dL (ref 0.50–1.35)
Glucose, Bld: 106 mg/dL — ABNORMAL HIGH (ref 70–99)
HCT: 44 % (ref 39.0–52.0)
Hemoglobin: 15 g/dL (ref 13.0–17.0)

## 2013-02-25 LAB — URINALYSIS, ROUTINE W REFLEX MICROSCOPIC
Nitrite: NEGATIVE
Specific Gravity, Urine: 1.016 (ref 1.005–1.030)
Urobilinogen, UA: 1 mg/dL (ref 0.0–1.0)
pH: 6.5 (ref 5.0–8.0)

## 2013-02-25 LAB — CK TOTAL AND CKMB (NOT AT ARMC)
CK, MB: 5.8 ng/mL — ABNORMAL HIGH (ref 0.3–4.0)
Total CK: 300 U/L — ABNORMAL HIGH (ref 7–232)

## 2013-02-25 LAB — PROTIME-INR: INR: 1.04 (ref 0.00–1.49)

## 2013-02-25 NOTE — ED Provider Notes (Addendum)
CSN: [id number]     Arrival date & time 02/25/13  0236 History   First MD Initiated Contact with Patient 02/25/13 0247     Chief Complaint  Patient presents with  . Seizures   (Consider location/radiation/quality/duration/timing/severity/associated sxs/prior Treatment) Patient is a 52 y.o. male presenting with seizures. The history is provided by the patient. History limited by: amnesia.  Seizures Seizure activity on arrival: no   Seizure type:  Unable to specify Preceding symptoms: no sensation of an aura present   Initial focality:  Unable to specify Episode characteristics: no incontinence   Postictal symptoms: no somnolence   Return to baseline: yes   Severity:  Mild Timing:  Once Progression:  Resolved Context: previous head injury   Just seen for vertigo.  Claims room mate witnessed seizures  Past Medical History  Diagnosis Date  . Hypertension   . Sleep apnea   . Traumatic brain injury     2013  . Cardiomyopathy   . Seizures   . COPD (chronic obstructive pulmonary disease)    Past Surgical History  Procedure Laterality Date  . Appendectomy    . Leg surgery    . Cardiac catheterization  11/2008   Family History  Problem Relation Age of Onset  . Heart failure Father   . Diabetes Mother   . Diabetes Brother   . Heart failure Paternal Grandfather   . Diabetes Father    History  Substance Use Topics  . Smoking status: Current Every Day Smoker  . Smokeless tobacco: Not on file  . Alcohol Use: 8.4 oz/week    14 Cans of beer per week     Comment: will sometimes drink until he "losses count"    Review of Systems  Neurological: Positive for seizures.  All other systems reviewed and are negative.    Allergies  Lisinopril  Home Medications   Current Outpatient Rx  Name  Route  Sig  Dispense  Refill  . aspirin 81 MG EC tablet   Oral   Take 1 tablet (81 mg total) by mouth daily.   30 tablet   3   . calcium carbonate (TUMS - DOSED IN MG ELEMENTAL  CALCIUM) 500 MG chewable tablet   Oral   Chew 1 tablet (200 mg of elemental calcium total) by mouth daily as needed for heartburn.   30 tablet   3   . HYDROcodone-acetaminophen (NORCO) 7.5-325 MG per tablet   Oral   Take 1 tablet by mouth every 4 (four) hours as needed for pain.          Marland Kitchen HYDROcodone-acetaminophen (NORCO/VICODIN) 5-325 MG per tablet   Oral   Take 1 tablet by mouth every 4 (four) hours as needed for pain.   15 tablet   0   . LORazepam (ATIVAN) 0.5 MG tablet   Oral   Take 0.5 mg by mouth every 6 (six) hours as needed for anxiety.          . meclizine (ANTIVERT) 50 MG tablet   Oral   Take 1 tablet (50 mg total) by mouth 3 (three) times daily as needed.   15 tablet   0   . metoprolol (LOPRESSOR) 50 MG tablet   Oral   Take 1 tablet (50 mg total) by mouth daily.   30 tablet   3    BP 152/79  Pulse 87  Temp(Src) 98.8 F (37.1 C) (Oral)  Resp 18  SpO2 92% Physical Exam  Constitutional: He is oriented  to person, place, and time. He appears well-developed and well-nourished. No distress.  HENT:  Head: Normocephalic and atraumatic. Head is without raccoon's eyes and without Battle's sign.  Right Ear: No mastoid tenderness. No hemotympanum.  Left Ear: No mastoid tenderness. No hemotympanum.  Mouth/Throat: Oropharynx is clear and moist.  Eyes: Conjunctivae and EOM are normal. Pupils are equal, round, and reactive to light.  Neck: Normal range of motion. Neck supple.  Cardiovascular: Normal rate, regular rhythm and intact distal pulses.   Pulmonary/Chest: Effort normal and breath sounds normal. He has no wheezes. He has no rales.  Abdominal: Soft. Bowel sounds are normal. There is no tenderness. There is no rebound and no guarding.  Musculoskeletal: Normal range of motion.  Neurological: He is alert and oriented to person, place, and time. He has normal reflexes. No cranial nerve deficit.  Skin: Skin is warm and dry.  Psychiatric: He has a normal mood and  affect.    ED Course  Procedures (including critical care time) Labs Review Labs Reviewed  CBC WITH DIFFERENTIAL  URINE RAPID DRUG SCREEN (HOSP PERFORMED)  PROTIME-INR  URINALYSIS, ROUTINE W REFLEX MICROSCOPIC  ETHANOL   Imaging Review Dg Chest 2 View  02/24/2013   CLINICAL DATA:  Shortness of Breath, mid chest pressure  EXAM: CHEST  2 VIEW  COMPARISON:  02/22/2013  FINDINGS: The heart size and mediastinal contours are within normal limits. Both lungs are clear. The visualized skeletal structures are unremarkable.  IMPRESSION: No active cardiopulmonary disease.  No significant change.   Electronically Signed   By: Natasha Mead   On: 02/24/2013 09:47    MDM  No diagnosis found.  Date: 02/25/2013  Rate: 87  Rhythm: normal sinus rhythm  QRS Axis: normal  Intervals: normal  ST/T Wave abnormalities: normal  Conduction Disutrbances: none  Narrative Interpretation: unremarkable  Patient likely withdrawing from [facility] and pain medication   Per Dr. Thad Ranger of neurology, do not start anti epileptics.  Check CK if markedly elevated will need referral for EEG.     Cannot drive until cleared by neurology.  Jasmine Awe, MD 02/25/13 7829  Jasmine Awe, MD 02/25/13 [id number]

## 2013-02-25 NOTE — Discharge Summary (Signed)
  Date: 02/25/2013  Patient name: Robert Buck  Medical record number: [id number]  Date of birth: [birth year]   This patient has been seen and the plan of care was discussed with the house staff. Please see their note for complete details. I concur with their findings and plan.  Jonah Blue, DO, FACP Faculty [facility] Internal Medicine Residency Program 02/25/2013, 2:10 PM

## 2013-02-25 NOTE — ED Notes (Signed)
Pt stated he was at home he got up from the crouch and next thing he new his roommate was waking him up.pt stated that the roommate said he had a SZ.pt is oriented but lethargic

## 2013-03-02 ENCOUNTER — Emergency Department (HOSPITAL_COMMUNITY)
Admission: EM | Admit: 2013-03-02 | Discharge: 2013-03-03 | Disposition: A | Discharge status: Home / Self Care | Payer: 59 | Attending: Emergency Medicine | Admitting: Emergency Medicine

## 2013-03-02 DIAGNOSIS — Y929 Unspecified place or not applicable: Secondary | ICD-10-CM | POA: Insufficient documentation

## 2013-03-02 DIAGNOSIS — R519 Headache, unspecified: Secondary | ICD-10-CM

## 2013-03-02 DIAGNOSIS — W19XXXA Unspecified fall, initial encounter: Secondary | ICD-10-CM

## 2013-03-02 DIAGNOSIS — Z87898 Personal history of other specified conditions: Secondary | ICD-10-CM

## 2013-03-02 DIAGNOSIS — R12 Heartburn: Secondary | ICD-10-CM | POA: Insufficient documentation

## 2013-03-02 DIAGNOSIS — Z7982 Long term (current) use of aspirin: Secondary | ICD-10-CM | POA: Insufficient documentation

## 2013-03-02 DIAGNOSIS — J449 Chronic obstructive pulmonary disease, unspecified: Secondary | ICD-10-CM | POA: Insufficient documentation

## 2013-03-02 DIAGNOSIS — S0990XA Unspecified injury of head, initial encounter: Secondary | ICD-10-CM | POA: Insufficient documentation

## 2013-03-02 DIAGNOSIS — Z87820 Personal history of traumatic brain injury: Secondary | ICD-10-CM | POA: Insufficient documentation

## 2013-03-02 DIAGNOSIS — F17200 Nicotine dependence, unspecified, uncomplicated: Secondary | ICD-10-CM | POA: Insufficient documentation

## 2013-03-02 DIAGNOSIS — J4489 Other specified chronic obstructive pulmonary disease: Secondary | ICD-10-CM | POA: Insufficient documentation

## 2013-03-02 DIAGNOSIS — R110 Nausea: Secondary | ICD-10-CM | POA: Insufficient documentation

## 2013-03-02 DIAGNOSIS — I10 Essential (primary) hypertension: Secondary | ICD-10-CM | POA: Insufficient documentation

## 2013-03-02 DIAGNOSIS — Z8669 Personal history of other diseases of the nervous system and sense organs: Secondary | ICD-10-CM | POA: Insufficient documentation

## 2013-03-02 DIAGNOSIS — Y939 Activity, unspecified: Secondary | ICD-10-CM | POA: Insufficient documentation

## 2013-03-02 DIAGNOSIS — R55 Syncope and collapse: Secondary | ICD-10-CM | POA: Insufficient documentation

## 2013-03-02 DIAGNOSIS — Z79899 Other long term (current) drug therapy: Secondary | ICD-10-CM | POA: Insufficient documentation

## 2013-03-02 DIAGNOSIS — W1809XA Striking against other object with subsequent fall, initial encounter: Secondary | ICD-10-CM | POA: Insufficient documentation

## 2013-03-02 NOTE — ED Notes (Signed)
Pt was downtown, felt nauseated then fell striking posterior head on cement.  Witnesses report positive LOC lasting no more than a couple minutes.

## 2013-03-03 ENCOUNTER — Emergency Department (HOSPITAL_COMMUNITY): Payer: 59

## 2013-03-03 ENCOUNTER — Encounter (HOSPITAL_COMMUNITY): Payer: Self-pay | Admitting: Emergency Medicine

## 2013-03-03 LAB — BASIC METABOLIC PANEL
CO2: 24 mEq/L (ref 19–32)
Calcium: 9.3 mg/dL (ref 8.4–10.5)
Chloride: 100 mEq/L (ref 96–112)
Glucose, Bld: 85 mg/dL (ref 70–99)
Sodium: 136 mEq/L (ref 135–145)

## 2013-03-03 LAB — CBC WITH DIFFERENTIAL/PLATELET
Eosinophils Absolute: 0.1 10*3/uL (ref 0.0–0.7)
Eosinophils Relative: 3 % (ref 0–5)
HCT: 41.8 % (ref 39.0–52.0)
Lymphocytes Relative: 33 % (ref 12–46)
Lymphs Abs: 1.4 10*3/uL (ref 0.7–4.0)
MCH: 33.9 pg (ref 26.0–34.0)
MCV: 95.9 fL (ref 78.0–100.0)
Monocytes Absolute: 0.4 10*3/uL (ref 0.1–1.0)
Monocytes Relative: 9 % (ref 3–12)
Platelets: 152 10*3/uL (ref 150–400)
RBC: 4.36 MIL/uL (ref 4.22–5.81)
WBC: 4.3 10*3/uL (ref 4.0–10.5)

## 2013-03-03 LAB — TROPONIN I: Troponin I: <0.3 ng/mL (ref <0.30)

## 2013-03-03 MED ORDER — METOCLOPRAMIDE HCL 5 MG/ML IJ SOLN: 10.0000 mg | Freq: Once | INTRAMUSCULAR | Status: DC

## 2013-03-03 MED ORDER — DIPHENHYDRAMINE HCL 50 MG/ML IJ SOLN
50.0000 mg | Freq: Once | INTRAMUSCULAR | Status: AC
  Administered 2013-03-03: 50 mg via INTRAMUSCULAR
  Filled 2013-03-03: qty 1

## 2013-03-03 MED ORDER — METOCLOPRAMIDE HCL 5 MG/ML IJ SOLN
10.0000 mg | Freq: Once | INTRAMUSCULAR | Status: AC
  Administered 2013-03-03: 10 mg via INTRAMUSCULAR
  Filled 2013-03-03: qty 2

## 2013-03-03 MED ORDER — DIPHENHYDRAMINE HCL 50 MG/ML IJ SOLN: 25.0000 mg | Freq: Once | INTRAMUSCULAR | Status: DC

## 2013-03-03 MED ORDER — OXYCODONE-ACETAMINOPHEN 5-325 MG PO TABS
1.0000 | ORAL_TABLET | Freq: Once | ORAL | Status: AC
  Administered 2013-03-03: 1 via ORAL
  Filled 2013-03-03: qty 1

## 2013-03-03 NOTE — ED Notes (Signed)
Ambulated pt in hallway.  Legs wobbly, a bit unsteady at first then tended to be more steady.

## 2013-03-03 NOTE — ED Notes (Signed)
MD at bedside. 

## 2013-03-03 NOTE — ED Provider Notes (Signed)
CSN: [id number]     Arrival date & time 03/02/13  2351 History   First MD Initiated Contact with Patient 03/03/13 0022     Chief Complaint  Patient presents with  . Fall   Patient is a 52 y.o. male presenting with fall. The history is provided by the patient.  Fall This is a new problem. Episode onset: just prior to arrival. Associated symptoms include headaches. Pertinent negatives include no shortness of breath. Nothing aggravates the symptoms. Nothing relieves the symptoms. He has tried rest for the symptoms. The treatment provided no relief.  pt reports he was downtown tonight He reports he felt some nausea and then he "passed out" Per witnesses pt was unconscious for several minutes then woke up  Pt reports he has had this before.  He reports he has h/o seizures and he will at times pass out.   He reports just prior to passing out he did have nausea and some heartburn He has no active CP/SOB at this time No focal weakness He reports headache but no neck or back pain   Past Medical History  Diagnosis Date  . Hypertension   . Sleep apnea   . Traumatic brain injury     2013  . Cardiomyopathy   . Seizures   . COPD (chronic obstructive pulmonary disease)    Past Surgical History  Procedure Laterality Date  . Appendectomy    . Leg surgery    . Cardiac catheterization  11/2008   Family History  Problem Relation Age of Onset  . Heart failure Father   . Diabetes Mother   . Diabetes Brother   . Heart failure Paternal Grandfather   . Diabetes Father    History  Substance Use Topics  . Smoking status: Current Every Day Smoker -- 0.50 packs/day    Types: Cigarettes  . Smokeless tobacco: Not on file  . Alcohol Use: 8.4 oz/week    14 Cans of beer per week     Comment: will sometimes drink until he "losses count"  3 times weekly    Review of Systems  Constitutional: Negative for fever.  Respiratory: Negative for shortness of breath.   Gastrointestinal: Negative for  vomiting.  Neurological: Positive for syncope and headaches.  All other systems reviewed and are negative.    Allergies  Lisinopril  Home Medications   Current Outpatient Rx  Name  Route  Sig  Dispense  Refill  . aspirin 81 MG EC tablet   Oral   Take 1 tablet (81 mg total) by mouth daily.   30 tablet   3   . calcium carbonate (TUMS - DOSED IN MG ELEMENTAL CALCIUM) 500 MG chewable tablet   Oral   Chew 1 tablet (200 mg of elemental calcium total) by mouth daily as needed for heartburn.   30 tablet   3   . HYDROcodone-acetaminophen (NORCO/VICODIN) 5-325 MG per tablet   Oral   Take 1 tablet by mouth every 4 (four) hours as needed for pain.   15 tablet   0   . meclizine (ANTIVERT) 50 MG tablet   Oral   Take 1 tablet (50 mg total) by mouth 3 (three) times daily as needed.   15 tablet   0   . metoprolol succinate (TOPROL-XL) 50 MG 24 hr tablet   Oral   Take 50 mg by mouth daily. Take with or immediately following a meal.         . Multiple Vitamin (MULTIVITAMIN WITH  MINERALS) TABS tablet   Oral   Take 1 tablet by mouth daily.          BP 128/85  Temp(Src) 98.6 F (37 C) (Oral)  SpO2 94% Physical Exam CONSTITUTIONAL: Well developed/well nourished HEAD: dried blood noted to scalp.  No other signs of trauma EYES: EOMI/PERRL ENMT: Mucous membranes moist NECK: supple no meningeal signs SPINE:entire spine nontender, No bruising/crepitance/stepoffs noted to spine CV: S1/S2 noted, no murmurs/rubs/gallops noted LUNGS: Lungs are clear to auscultation bilaterally, no apparent distress ABDOMEN: soft, nontender, no rebound or guarding GU:no cva tenderness NEURO: Pt is awake/alert, moves all extremitiesx4, no arm or leg drift is noted EXTREMITIES: pulses normal, full ROM SKIN: warm, color normal PSYCH: no abnormalities of mood noted  ED Course  Procedures (including critical care time) Labs Review Labs Reviewed  BASIC METABOLIC PANEL  CBC WITH DIFFERENTIAL   TROPONIN I  ETHANOL   1:27 AM Pt with syncopal episode vs seizure He is currently awake/alert, no distress at this time Will follow closely 3:54 AM Pt stable and feels improved No focal motor deficits noted on my evaluation He is ambulatory Advised need for f/u on seizures and given no driving precautions He just had recent cardiac workup do not feel this needs repeated Stable for d/c home  MDM  No diagnosis found. Nursing notes including past medical history and social history reviewed and considered in documentation Labs/vital reviewed and considered Previous records reviewed and considered - recent admission reviewed, echo showed EF at 60%    Date: 03/03/2013  Rate: 67  Rhythm: normal sinus rhythm  QRS Axis: normal  Intervals: normal  ST/T Wave abnormalities: nonspecific ST changes  Conduction Disutrbances:none  Narrative Interpretation:   Old EKG Reviewed: unchanged from prior    Joya Gaskins, MD 03/03/13 [phone]

## 2013-03-04 ENCOUNTER — Encounter (HOSPITAL_COMMUNITY): Payer: Self-pay | Admitting: Emergency Medicine

## 2013-03-04 ENCOUNTER — Emergency Department (EMERGENCY_DEPARTMENT_HOSPITAL)
Admission: EM | Admit: 2013-03-04 | Discharge: 2013-03-05 | Disposition: A | Discharge status: Other Acute Inpatient Hospital | Payer: 59 | Source: Home / Self Care | Attending: Emergency Medicine | Admitting: Emergency Medicine

## 2013-03-04 DIAGNOSIS — Z0289 Encounter for other administrative examinations: Secondary | ICD-10-CM | POA: Insufficient documentation

## 2013-03-04 DIAGNOSIS — J449 Chronic obstructive pulmonary disease, unspecified: Secondary | ICD-10-CM | POA: Diagnosis present

## 2013-03-04 DIAGNOSIS — Z87820 Personal history of traumatic brain injury: Secondary | ICD-10-CM | POA: Insufficient documentation

## 2013-03-04 DIAGNOSIS — F3289 Other specified depressive episodes: Secondary | ICD-10-CM | POA: Diagnosis present

## 2013-03-04 DIAGNOSIS — F1010 Alcohol abuse, uncomplicated: Secondary | ICD-10-CM

## 2013-03-04 DIAGNOSIS — J4489 Other specified chronic obstructive pulmonary disease: Secondary | ICD-10-CM | POA: Insufficient documentation

## 2013-03-04 DIAGNOSIS — F329 Major depressive disorder, single episode, unspecified: Secondary | ICD-10-CM

## 2013-03-04 DIAGNOSIS — F17200 Nicotine dependence, unspecified, uncomplicated: Secondary | ICD-10-CM | POA: Insufficient documentation

## 2013-03-04 DIAGNOSIS — I428 Other cardiomyopathies: Secondary | ICD-10-CM | POA: Insufficient documentation

## 2013-03-04 DIAGNOSIS — Z79899 Other long term (current) drug therapy: Secondary | ICD-10-CM

## 2013-03-04 DIAGNOSIS — R45851 Suicidal ideations: Secondary | ICD-10-CM

## 2013-03-04 DIAGNOSIS — F1020 Alcohol dependence, uncomplicated: Principal | ICD-10-CM | POA: Diagnosis present

## 2013-03-04 DIAGNOSIS — G4730 Sleep apnea, unspecified: Secondary | ICD-10-CM | POA: Diagnosis present

## 2013-03-04 DIAGNOSIS — I10 Essential (primary) hypertension: Secondary | ICD-10-CM | POA: Insufficient documentation

## 2013-03-04 DIAGNOSIS — Z7982 Long term (current) use of aspirin: Secondary | ICD-10-CM | POA: Insufficient documentation

## 2013-03-04 LAB — RAPID URINE DRUG SCREEN, HOSP PERFORMED
Amphetamines: NOT DETECTED
Benzodiazepines: NOT DETECTED
Cocaine: NOT DETECTED
Opiates: NOT DETECTED
Tetrahydrocannabinol: POSITIVE — AB

## 2013-03-04 LAB — COMPREHENSIVE METABOLIC PANEL
AST: 34 U/L (ref 0–37)
Albumin: 4 g/dL (ref 3.5–5.2)
BUN: 14 mg/dL (ref 6–23)
Calcium: 9.5 mg/dL (ref 8.4–10.5)
Chloride: 103 mEq/L (ref 96–112)
Creatinine, Ser: 1.02 mg/dL (ref 0.50–1.35)
Total Bilirubin: 0.6 mg/dL (ref 0.3–1.2)
Total Protein: 7.2 g/dL (ref 6.0–8.3)

## 2013-03-04 LAB — CBC
MCH: 33.6 pg (ref 26.0–34.0)
MCHC: 34.7 g/dL (ref 30.0–36.0)
MCV: 96.9 fL (ref 78.0–100.0)
Platelets: 160 10*3/uL (ref 150–400)
RDW: 14.4 % (ref 11.5–15.5)
WBC: 7.8 10*3/uL (ref 4.0–10.5)

## 2013-03-04 LAB — ETHANOL: Alcohol, Ethyl (B): <11 mg/dL (ref 0–11)

## 2013-03-04 LAB — SALICYLATE LEVEL: Salicylate Lvl: <2 mg/dL — ABNORMAL LOW (ref 2.8–20.0)

## 2013-03-04 MED ORDER — TRAZODONE HCL 50 MG PO TABS: 50.0000 mg | ORAL_TABLET | Freq: Every evening | ORAL | Status: DC | PRN

## 2013-03-04 MED ORDER — ONDANSETRON 4 MG PO TBDP: 4.0000 mg | ORAL_TABLET | Freq: Four times a day (QID) | ORAL | Status: DC | PRN

## 2013-03-04 MED ORDER — LORAZEPAM 1 MG PO TABS: 0.0000 mg | ORAL_TABLET | Freq: Four times a day (QID) | ORAL | Status: DC

## 2013-03-04 MED ORDER — ONDANSETRON HCL 4 MG PO TABS: 4.0000 mg | ORAL_TABLET | Freq: Three times a day (TID) | ORAL | Status: DC | PRN

## 2013-03-04 MED ORDER — LOPERAMIDE HCL 2 MG PO CAPS: 2.0000 mg | ORAL_CAPSULE | ORAL | Status: DC | PRN

## 2013-03-04 MED ORDER — HYDROXYZINE HCL 25 MG PO TABS: 25.0000 mg | ORAL_TABLET | Freq: Four times a day (QID) | ORAL | Status: DC | PRN

## 2013-03-04 MED ORDER — CALCIUM CARBONATE ANTACID 500 MG PO CHEW
1.0000 | CHEWABLE_TABLET | Freq: Every day | ORAL | Status: DC | PRN
  Filled 2013-03-04: qty 1

## 2013-03-04 MED ORDER — LORAZEPAM 1 MG PO TABS: 0.0000 mg | ORAL_TABLET | Freq: Two times a day (BID) | ORAL | Status: DC

## 2013-03-04 MED ORDER — ASPIRIN EC 81 MG PO TBEC: 81.0000 mg | DELAYED_RELEASE_TABLET | Freq: Every day | ORAL | Status: DC

## 2013-03-04 MED ORDER — THIAMINE HCL 100 MG/ML IJ SOLN: 100.0000 mg | Freq: Once | INTRAMUSCULAR | Status: DC

## 2013-03-04 MED ORDER — ACETAMINOPHEN 325 MG PO TABS: 650.0000 mg | ORAL_TABLET | Freq: Four times a day (QID) | ORAL | Status: DC | PRN

## 2013-03-04 MED ORDER — VITAMIN B-1 100 MG PO TABS
100.0000 mg | ORAL_TABLET | Freq: Every day | ORAL | Status: DC
  Administered 2013-03-04: 100 mg via ORAL
  Filled 2013-03-04: qty 1

## 2013-03-04 MED ORDER — ALUM & MAG HYDROXIDE-SIMETH 200-200-20 MG/5ML PO SUSP: 30.0000 mL | ORAL | Status: DC | PRN

## 2013-03-04 MED ORDER — NICOTINE 21 MG/24HR TD PT24: 21.0000 mg | MEDICATED_PATCH | Freq: Every day | TRANSDERMAL | Status: DC

## 2013-03-04 MED ORDER — ADULT MULTIVITAMIN W/MINERALS CH: 1.0000 | ORAL_TABLET | Freq: Every day | ORAL | Status: DC

## 2013-03-04 MED ORDER — IBUPROFEN 200 MG PO TABS: 600.0000 mg | ORAL_TABLET | Freq: Three times a day (TID) | ORAL | Status: DC | PRN

## 2013-03-04 MED ORDER — METOPROLOL SUCCINATE ER 50 MG PO TB24: 50.0000 mg | ORAL_TABLET | Freq: Every day | ORAL | Status: DC

## 2013-03-04 MED ORDER — CHLORDIAZEPOXIDE HCL 25 MG PO CAPS: 25.0000 mg | ORAL_CAPSULE | Freq: Four times a day (QID) | ORAL | Status: DC | PRN

## 2013-03-04 MED ORDER — ADULT MULTIVITAMIN W/MINERALS CH
1.0000 | ORAL_TABLET | Freq: Every day | ORAL | Status: DC
  Administered 2013-03-04: 1 via ORAL
  Filled 2013-03-04: qty 1

## 2013-03-04 MED ORDER — MAGNESIUM HYDROXIDE 400 MG/5ML PO SUSP
30.0000 mL | Freq: Every day | ORAL | Status: DC | PRN
  Filled 2013-03-04: qty 30
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
## 2013-03-04 NOTE — ED Notes (Signed)
Pt arrived to the Ed with a need for medical clearance due to alcohol abuse. {t is requesting detox.  Pt's wife recently died in Oct 10, 2022 and he has been unable to control his drinking.  Pt states that his last drink was right before he entered the ED.

## 2013-03-04 NOTE — Progress Notes (Signed)
[name] noted medicaid pt without a pcp listed and no updated medicaid card listed.  CM spoke with the pt who confirmed he is from [facility] with a provider at [facility] Laurell Roof [place] [address] [place], [place] [address]  CM encouraged pt and discussed pt's responsibility to verify with pt's insurance carrier that any recommended medical provider offered by any emergency room or a hospital provider is within the carrier's network. The pt voiced understanding     CM discussed with pt the need to verify with DSS who is pcp is, how to request a change of medicaid pcp and process for changing medicaid from county to county and from state to state Pt voiced understanding   Reports he has been residing in [place] since after the death of his wife but plans are to return to [place].    Pt with monitor alarms from 28-40 for bradycardia while the CM was present in his room while speaking with him Pt during these alarms to be raising his right arm over his head His ED RN notified

## 2013-03-04 NOTE — ED Notes (Signed)
Patient denies dizziness, SOB, chest pain, blurred vision, heart palpitations.

## 2013-03-04 NOTE — ED Notes (Signed)
Pt also has a hx of seizures.  Pt states that he has been having seizures more often since his alcohol consumption has increased in April.

## 2013-03-04 NOTE — ED Notes (Signed)
TTS at bedside. 
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
## 2013-03-05 ENCOUNTER — Encounter (HOSPITAL_COMMUNITY): Payer: Self-pay

## 2013-03-05 ENCOUNTER — Inpatient Hospital Stay (HOSPITAL_COMMUNITY)
Admission: AD | Admit: 2013-03-05 | Discharge: 2013-03-06 | DRG: 897 | Disposition: A | Discharge status: Home / Self Care | Payer: 59 | Source: Intra-hospital | Attending: Psychiatry | Admitting: Psychiatry

## 2013-03-05 DIAGNOSIS — F329 Major depressive disorder, single episode, unspecified: Secondary | ICD-10-CM

## 2013-03-05 DIAGNOSIS — F1020 Alcohol dependence, uncomplicated: Principal | ICD-10-CM

## 2013-03-05 DIAGNOSIS — F4323 Adjustment disorder with mixed anxiety and depressed mood: Secondary | ICD-10-CM

## 2013-03-05 MED ORDER — ONDANSETRON 4 MG PO TBDP: 4.0000 mg | ORAL_TABLET | Freq: Four times a day (QID) | ORAL | Status: DC | PRN

## 2013-03-05 MED ORDER — QUETIAPINE FUMARATE 100 MG PO TABS
100.0000 mg | ORAL_TABLET | Freq: Every day | ORAL | Status: DC
  Administered 2013-03-05: 100 mg via ORAL
  Filled 2013-03-05 (×2): qty 1

## 2013-03-05 MED ORDER — HYDROXYZINE HCL 25 MG PO TABS
25.0000 mg | ORAL_TABLET | Freq: Four times a day (QID) | ORAL | Status: DC | PRN
  Administered 2013-03-05: 25 mg via ORAL

## 2013-03-05 MED ORDER — MAGNESIUM HYDROXIDE 400 MG/5ML PO SUSP: 30.0000 mL | Freq: Every day | ORAL | Status: DC | PRN

## 2013-03-05 MED ORDER — CHLORDIAZEPOXIDE HCL 25 MG PO CAPS: 25.0000 mg | ORAL_CAPSULE | Freq: Four times a day (QID) | ORAL | Status: DC | PRN

## 2013-03-05 MED ORDER — ADULT MULTIVITAMIN W/MINERALS CH
1.0000 | ORAL_TABLET | Freq: Every day | ORAL | Status: DC
  Administered 2013-03-06: 1 via ORAL
  Filled 2013-03-05 (×3): qty 1

## 2013-03-05 MED ORDER — LOPERAMIDE HCL 2 MG PO CAPS: 2.0000 mg | ORAL_CAPSULE | ORAL | Status: DC | PRN

## 2013-03-05 MED ORDER — TRAZODONE HCL 50 MG PO TABS
50.0000 mg | ORAL_TABLET | Freq: Every evening | ORAL | Status: DC | PRN
  Administered 2013-03-05 (×2): 50 mg via ORAL
  Filled 2013-03-05: qty 1

## 2013-03-05 MED ORDER — THIAMINE HCL 100 MG/ML IJ SOLN: 100.0000 mg | Freq: Once | INTRAMUSCULAR | Status: DC

## 2013-03-05 MED ORDER — VITAMIN B-1 100 MG PO TABS
100.0000 mg | ORAL_TABLET | Freq: Every day | ORAL | Status: DC
  Administered 2013-03-06: 09:00:00 100 mg via ORAL
  Filled 2013-03-05 (×2): qty 1

## 2013-03-05 MED ORDER — ACETAMINOPHEN 325 MG PO TABS: 650.0000 mg | ORAL_TABLET | Freq: Four times a day (QID) | ORAL | Status: DC | PRN

## 2013-03-05 MED ORDER — ALUM & MAG HYDROXIDE-SIMETH 200-200-20 MG/5ML PO SUSP: 30.0000 mL | ORAL | Status: DC | PRN

## 2013-03-05 NOTE — Progress Notes (Signed)
D: Patient denies SI/HI and A/V hallucinations; reports anxiety and tremors  A: Monitored q 15 minutes; patient encouraged to attend groups; patient educated about medications; patient given medications per physician orders; patient encouraged to express feelings and/or concerns  R: Patient reports some relief of anxiety and no tremors were felt or observed; patient's interaction with staff and peers is appropriate and minimal;patient is taking medications as prescribed and tolerating medications; patient did not  attend any groups

## 2013-03-05 NOTE — Tx Team (Signed)
Initial Interdisciplinary Treatment Plan  PATIENT STRENGTHS: (choose at least two) Average or above average intelligence Capable of independent living Communication skills Supportive family/friends  PATIENT STRESSORS: Loss of Wife died October 03, 2012 Substance abuse   PROBLEM LIST: Prolem List/Patient Goals Date to be addressed Date deferred Reason deferred Estimated date of resolution  Learn coping skill to deal with wife's death and remain sober       Grief counseling                                                 DISCHARGE CRITERIA:  Improved stabilization in mood, thinking, and/or behavior Motivation to continue treatment in a less acute level of care Withdrawal symptoms are absent or subacute and managed without 24-hour nursing intervention  PRELIMINARY DISCHARGE PLAN: Attend 12-step recovery group Outpatient therapy Return to previous living arrangement  PATIENT/FAMIILY INVOLVEMENT: This treatment plan has been presented to and reviewed with the patient, Robert Buck, and/or family member, .  The patient and family have been given the opportunity to ask questions and make suggestions.  Cooper Render 03/05/2013, 2:10 AM
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
## 2013-03-05 NOTE — Progress Notes (Signed)
Patient did not attend 0900 RN group. 
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
## 2013-03-05 NOTE — Progress Notes (Signed)
Patient signed a 72 hour discharge on 03/05/13 at 1831 hrs. 72 hour discharge will be up on Friday 03/08/13 at 1831 hrs.

## 2013-03-05 NOTE — Progress Notes (Signed)
Adult Psychoeducational Group Note  Date:  03/05/2013 Time:  1:12 PM  Group Topic/Focus:  Recovery Goals:   The focus of this group is to identify appropriate goals for recovery and establish a plan to achieve them.  Participation Level:  Did Not Attend  Robert Buck 03/05/2013, 1:12 PM

## 2013-03-05 NOTE — [facility] Suicide Risk Assessment (Signed)
[facility] INPATIENT: Family/Significant Other Suicide Prevention Education  Suicide Prevention Education:  Education Completed; No one has been identified by the patient as the family member/significant other with whom the patient will be residing, and identified as the person(s) who will aid the patient in the event of a mental health crisis (suicidal ideations/suicide attempt).   Pt did not c/o SI at admission, nor have they endorsed SI during their stay here. SPE not required. SPI pamphlet provided to pt and he was encouraged to ask questions, talk about concerns, and share with his support network.   [employer], LCSWA 03/05/2013 10:38 AM

## 2013-03-05 NOTE — [facility] Group Notes (Signed)
[facility] LCSW Group Therapy  03/05/2013 1:26 PM  Type of Therapy:  Group Therapy  Participation Level:  Active  Participation Quality:  Attentive  Affect:  Appropriate  Cognitive:  Alert and Oriented  Insight:  Engaged  Engagement in Therapy:  Improving  Modes of Intervention:  Discussion, Education, Exploration, Socialization and Support  Summary of Progress/Problems: MHA Speaker came to talk about his personal journey with substance abuse and addiction. The pt processed ways by which to relate to the speaker. MHA speaker provided handouts and educational information pertaining to groups and services offered by the [facility]. Robert Buck was engaged and attentive throughout today's group. He shows progress in the group setting AEB his ability to listen respectfully and actively participate in group. Tim listened as others asked the speaker questions and thanked him for coming to speak to the group today.    Buck, Robert Dax 03/05/2013, 1:26 PM

## 2013-03-05 NOTE — Progress Notes (Addendum)
Nutrition Brief Note  Pt meets criteria for severe MALNUTRITION in the context of social/environmental circumstances as evidenced by <75% estimated energy intake with 23.9% weight loss in the past 5 months per pt report.  Patient identified on the Malnutrition Screening Tool (MST) Report.  Wt Readings from Last 10 Encounters:  03/05/13 200 lb (90.719 kg)  03/04/13 216 lb (97.977 kg)  02/22/13 218 lb 11.2 oz (99.202 kg)  02/12/13 218 lb (98.884 kg)  12/25/12 210 lb (95.255 kg)   Body mass index is 30.42 kg/(m^2). Patient meets criteria for class I obesity based on current BMI.   Discussed intake PTA with patient and compared to intake presently. Pt's wife passed away in Oct 20, 2022 of this year and pt reports going from 263 pounds then to 200 pounds now. Pt states this is due to not having an appetite and doing a lot of drinking, 1 case of beer/day. Pt getting daily multivitamin.  Met with pt who reports eating only 1 meal/day PTA. Reports his appetite has been getting better since admission, ate some of his lunch today. Denies wanting any nutritional supplements.   Current diet order is regular and pt is also offered choice of unit snacks mid-morning and mid-afternoon.  Pt is eating as desired.   Labs and medications reviewed.   Nutrition Dx:  Unintended wt change r/t suboptimal oral intake AEB pt report  Interventions:   Discussed the importance of nutrition and encouraged intake of food and beverages.     Encouraged pt to start getting on a regular meal schedule to promote a healthy diet.  No additional nutrition interventions warranted at this time. If nutrition issues arise, please consult RD.   Levon Hedger MS, RD, LDN [phone] Pager [phone] After Hours Pager

## 2013-03-05 NOTE — [facility] Group Notes (Signed)
Adult Psychoeducational Group Note  Date:  03/05/2013 Time:  10:22 PM  Group Topic/Focus:  AA  Participation Level:  Did Not Attend  Participation Quality:  None  Affect:  None   Cognitive:  None  Insight: None  Engagement in Group:  None  Modes of Intervention:  Discussion  Additional Comments:  Robert Buck did not attend group.  Robert Buck A 03/05/2013, 10:22 PM

## 2013-03-05 NOTE — Progress Notes (Signed)
Recreation Therapy Notes  Date: 09.23.2014 Time: 2:30pm Location: 300 Hall Dayroom  Group Topic: Animal Assisted Activities (AAA)  Behavioral Response: Appropriate  Affect: Flat  Clinical Observations/Feedback: Dog Team: [employer]. Patient interacted appropriately with peer, dog team, LRT and MHT.   Marykay Lex Lasheika Ortloff, LRT/CTRS  Jaydi Bray L 03/05/2013 9:08 PM

## 2013-03-05 NOTE — Progress Notes (Signed)
Recreation Therapy Notes  Date: 09.23.2014 Time: 3:00pm Location: 300 Hall Dayroom  Group Topic: Communication, Team Building, Problem Solving  Goal Area(s) Addresses:  Patient will effectively work with peer towards shared goal.  Patient will identify skill used to make activity successful.  Patient will identify how skills used during activity can be used to reach post d/c goals.   Behavioral Response: Appropriate, Attentive, Drowsy  Intervention: Problem Solving Activitiy  Activity: Life Boat. Patients were given a scenario about being on a sinking yacht. Patients were informed the yacht included 15 guest, 8 of which could be placed on the life boat, along with all group members. Individuals on guest list were of varying socioeconomic classes such as a Education officer, museum, Materials engineer, Midwife, Tree surgeon.   Education: Customer service manager, Discharge Planning   Education Outcome: Needs additional education  Clinical Observations/Feedback: Patient began group engaged, contributing to opening discussion. Patient additionally actively participated in group activity, voicing his opinion and debating appropriately with group members. Approximately half way through group session patient began dosing off while sitting up, patient attempted to stay awake, however he was ultimately unable to do so. At approximately 3:20pm patient exited group session, patient did not return.   Marykay Lex Daune Divirgilio, LRT/CTRS  Jearl Klinefelter 03/05/2013 9:19 PM

## 2013-03-05 NOTE — [facility] Suicide Risk Assessment (Signed)
Suicide Risk Assessment  Admission Assessment     Nursing information obtained from:  Patient Demographic factors:  Male;Divorced or widowed;Caucasian;Living alone;Unemployed Current Mental Status:  NA Loss Factors:  Loss of significant relationship Historical Factors:  NA Risk Reduction Factors:  NA  CLINICAL FACTORS:   Depression:   Comorbid alcohol abuse/dependence Alcohol/Substance Abuse/Dependencies  COGNITIVE FEATURES THAT CONTRIBUTE TO RISK:  Polarized thinking Thought constriction (tunnel vision)    SUICIDE RISK:   Moderate:  Frequent suicidal ideation with limited intensity, and duration, some specificity in terms of plans, no associated intent, good self-control, limited dysphoria/symptomatology, some risk factors present, and identifiable protective factors, including available and accessible social support.  PLAN OF CARE: Supportive approach/coping skills/relapse prevention                               Detox as needed                               Reassess and address the co morbidities                               CBT/grief-loss  I certify that inpatient services furnished can reasonably be expected to improve the patient's condition.  Henslee Lottman A 03/05/2013, 1:17 PM
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
## 2013-03-05 NOTE — Progress Notes (Signed)
Pt. Was resting when writer approached for assessment. Pt. Denies SI and HI.  Had no complaints of pain or discomfort. NO A or VH.  Writer assessed withdrawal symptoms.   Discussed pt.'s day.  Pt. States his withdrawal symptoms have been minimal, to the point that he signed the 72hr. Discharge request today.  Pt. Had no complaints of pain or discomfort.

## 2013-03-06 DIAGNOSIS — F1994 Other psychoactive substance use, unspecified with psychoactive substance-induced mood disorder: Secondary | ICD-10-CM

## 2013-03-06 MED ORDER — QUETIAPINE FUMARATE 100 MG PO TABS: 100.0000 mg | ORAL_TABLET | Freq: Every day | ORAL | Status: AC

## 2013-03-06 MED ORDER — ASPIRIN 81 MG PO TBEC: 81.0000 mg | DELAYED_RELEASE_TABLET | Freq: Every day | ORAL | Status: AC

## 2013-03-06 MED ORDER — METOPROLOL SUCCINATE ER 50 MG PO TB24: 50.0000 mg | ORAL_TABLET | Freq: Every day | ORAL | Status: AC

## 2013-03-06 MED ORDER — TRAZODONE HCL 50 MG PO TABS: 50.0000 mg | ORAL_TABLET | Freq: Every evening | ORAL | Status: AC | PRN

## 2013-03-06 MED ORDER — ADULT MULTIVITAMIN W/MINERALS CH: 1.0000 | ORAL_TABLET | Freq: Every day | ORAL | Status: AC

## 2013-03-06 NOTE — Tx Team (Signed)
Interdisciplinary Treatment Plan Update (Adult)  Date: 03/06/2013  Time Reviewed: 9:37 AM  Progress in Treatment:  Attending groups: Intermittently  Participating in groups:  Yes, when he attends group.  Taking medication as prescribed: Yes  Tolerating medication: Yes  Family/Significant othe contact made: No. SPE not required for this pt.   Patient understands diagnosis: Yes, AEB seeking treatment for ETOH detox and mood stabilization.  Discussing patient identified problems/goals with staff: Yes  Medical problems stabilized or resolved: Yes  Denies suicidal/homicidal ideation: Yes, during admission, group, and self report.  Patient has not harmed self or Others: Yes  New problem(s) identified: pt experiencing moderate-severe pain (neck and back injury). Discharge Plan or Barriers: Pt plans to move back to [facility]) after d/c from [facility]. He plans to continue to see Dr. Loren Racer (PCP) for medication management. CSW gave pt information about Hospice bereavement counseling. Pt to call number when he gets home in order to arrange schedule for grief counseling.  Additional comments: This is a 52 year old Caucasian male. Admitted to [facility] from the [facility] with complaints of excessive alcohol consumption requesting detox. Patient reports,"I drank too much alcohol. Alcohol was taking too much control of my life. It started last Sep 27, 2022 after my wife died. She had surgery at [facility], threw a clot in her lungs that went to her heart, then she just died. I have been drinking heavily since her death just to cope. I'm probably an alcoholic. I could not survive without it. I drink anything with alcohol in it. I drink a case of beer daily. I don't do much of liquor. Beer is cheaper. I have not sort any substance abuse treatment until now. I would like to continue treatment some where after my discharge from here, may be? I was diagnosed with schizophrenia along time ago. I was  hearing voices then. But after I got married, the voices stopped. But since my wife passed, the voices return. I hear voices may be twice a month. I have tried Risperdal, Sinequan, Paxil and Seroquel. I like Seroquel because I take it at night and it calms me during the day. Please do not give me Tegretol".   Reason for Continuation of Hospitalization: Mood stabilization Medication management  Estimated length of stay: 1-2 days  For review of initial/current patient goals, please see plan of care.  Attendees:  Patient:    Family:    Physician: Geoffery Lyons MD 03/06/2013 9:37 AM   Nursing: Philippa Chester RN  03/06/2013 9:37 AM   Clinical Social Worker Hardie Veltre Smart, LCSWA  03/06/2013  9:37 AM   Other: Lupita Leash RN  03/06/2013 9:37 AM   Other: Darden Dates Nurse CM 03/06/2013 9:37 AM   Other: Massie Kluver, Community Care Coordinator  03/06/2013 9:37 AM   Other:    Scribe for Treatment Team:  Trula Slade LCSWA 03/06/2013 9:37 AM

## 2013-03-06 NOTE — BHH Suicide Risk Assessment (Signed)
Suicide Risk Assessment  Discharge Assessment     Demographic Factors:  Male and Caucasian  Mental Status Per Nursing Assessment::   On Admission:  NA  Current Mental Status by Physician: In full contact with reality. There are no suicidal ideas, plans or intent. His mood is euthymic,his affect is appropriate. There are no suicidal idea, plan or intent. There is no active withdrawal. He plans to go back to [place] where he has more support. States he is ready to pursue further work on his grief trough hospice   Loss Factors: Loss of significant relationship  Historical Factors: NA  Risk Reduction Factors:   Sense of responsibility to family and Positive social support  Continued Clinical Symptoms:  Depression:   Comorbid alcohol abuse/dependence Alcohol/Substance Abuse/Dependencies  Cognitive Features That Contribute To Risk:  Polarized thinking Thought constriction (tunnel vision)    Suicide Risk:  Minimal: No identifiable suicidal ideation.  Patients presenting with no risk factors but with morbid ruminations; may be classified as minimal risk based on the severity of the depressive symptoms  Discharge Diagnoses:   AXIS I:  Depressive Disorder NOS and Substance Induced Mood Disorder, Alcohol Dependence AXIS II:  Deferred AXIS III:   Past Medical History  Diagnosis Date  . Hypertension   . Sleep apnea   . Traumatic brain injury     2013  . Cardiomyopathy   . Seizures   . COPD (chronic obstructive pulmonary disease)    AXIS IV:  other psychosocial or environmental problems AXIS V:  61-70 mild symptoms  Plan Of Care/Follow-up recommendations:  Activity:  as tolerated Diet:  regular Follow up outpatient basis/Hospice/AA Is patient on multiple antipsychotic therapies at discharge:  No   Has Patient had three or more failed trials of antipsychotic monotherapy by history:  No  Recommended Plan for Multiple Antipsychotic Therapies: NA  Meliana Canner  A 03/06/2013, 12:18 PM

## 2013-03-06 NOTE — BHH Group Notes (Signed)
[facility] LCSW Aftercare Discharge Planning Group Note   03/06/2013 9:07 AM  Participation Quality:  DID NOT ATTEND   Smart, Lebron Quam

## 2013-03-06 NOTE — Progress Notes (Signed)
Patient ID: Robert Buck, male   DOB: 09/14/1960, 51 y.o.   MRN: [id number] Patient discharged per physician order; patient denies SI/HI and A/V hallucinations; patient received prescriptions and copy of AVS after it was reviewed; patient had no other questions or concerns at this time; patient verbalized and signed that he received all belongings; patient left the unit ambulatory with his bus pass

## 2013-03-06 NOTE — Progress Notes (Signed)
[facility] Adult Case Management Discharge Plan :  Will you be returning to the same living situation after discharge: Yes,  home to [place], [place] At discharge, do you have transportation home?:Yes,  pt's father. Do you have the ability to pay for your medications:Yes,  mental health  Release of information consent forms completed and in the chart;  Patient's signature needed at discharge.  Patient to Follow up at: Follow-up Information   Follow up with Dr. Chestine Spore [facility] On 03/12/2013. (Appt. with Dr. Audie Box at 9:30AM for hospital followup. If you have to cancel/reschedule, please call within 48 hours in order to do so or else Dr. Audie Box will not be able to see you in the future. )    Contact information:   [address]  [place], [place] [address] Phone: [phone] Fax: [phone]      Patient denies SI/HI:   Yes,  during admission, self report, and group.    Safety Planning and Suicide Prevention discussed:  Yes,  Pt did not require SPE. SPI pamphlet provided to pt and he was encouraged to ask questions, talk about concerns, and share with his support network.   Smart, [name] 03/06/2013, 10:46 AM

## 2013-03-11 NOTE — Progress Notes (Signed)
Patient Discharge Instructions:  After Visit Summary (AVS):   Faxed to:  03/11/13 Psychiatric Admission Assessment Note:   Faxed to:  03/11/13 Suicide Risk Assessment - Discharge Assessment:   Faxed to:  03/11/13 Faxed/Sent to the Next Level Care provider:  03/11/13 Faxed to Dr. Chestine Spore [facility]s @ [phone]  Jerelene Redden, 03/11/2013, 1:41 PM

## 2013-03-13 NOTE — Discharge Summary (Signed)
Physician Discharge Summary Note  Patient:  Robert Buck is an 52 y.o., male MRN:  [id number] DOB:  11-17-1960 Patient phone:  [phone] (home)  Patient address:   [address] [place] [address],   Date of Admission:  03/05/2013 Date of Discharge: 03/07/2013  Reason for Admission:  52 Y/O male who was admitted with increased S/S of depression with psychotic features, who since his wife died in 07-Oct-2022 of this year has increased his alcohol consumption in an attempt to deal with his grief. He requested detox and help stabilizing his mood  Discharge Diagnoses: Active Problems:   Alcohol dependence   Depressive disorder, not elsewhere classified  Review of Systems  Constitutional: Negative.   HENT: Negative.   Eyes: Negative.   Respiratory: Negative.   Cardiovascular: Negative.   Gastrointestinal: Negative.   Genitourinary: Negative.   Musculoskeletal: Negative.   Skin: Negative.   Neurological: Negative.   Endo/Heme/Allergies: Negative.   Psychiatric/Behavioral: Positive for depression and substance abuse. The patient is nervous/anxious.     DSM5:  Schizophrenia Disorders:   Obsessive-Compulsive Disorders:   Trauma-Stressor Disorders:  Posttraumatic Stress Disorder (309.81) Substance/Addictive Disorders:  Alcohol Related Disorder - Severe (303.90) Depressive Disorders:  Major Depressive Disorder - Moderate (296.22)  Axis Diagnosis:   AXIS I:  Substance Induced Mood Disorder AXIS II:  Deferred AXIS III:   Past Medical History  Diagnosis Date  . Hypertension   . Sleep apnea   . Traumatic brain injury     2013  . Cardiomyopathy   . Seizures   . COPD (chronic obstructive pulmonary disease)    AXIS IV:  other psychosocial or environmental problems AXIS V:  51-60 moderate symptoms  Level of Care:  OP  Hospital Course:  He was admitted started in individual and group psychotherapy. He required some Librium to help with the remaining withdrawal symptoms. He  was given Trazodone and Seroquel to help with the insomnia. He was was able to start dealing with his grief and expressed readiness to continue to work on it on an outpatient basis through Hospice. He expressed committment to sobriety aware that his drinking was not only affecting him but his family too. Upon D/C in full contact with reality. Mood euthymic, affect appropriate. Endorsed no suicidal ideas, plans or intent. No active S/S of withdrawal.   Consults:  None  Significant Diagnostic Studies:  As per the chart  Discharge Vitals:   Blood pressure 122/76, pulse 51, temperature 96.8 F (36 C), temperature source Oral, resp. rate 20, height 5\' 8"  (1.727 m), weight 90.719 kg (200 lb). Body mass index is 30.42 kg/(m^2). Lab Results:   No results found for this or any previous visit (from the past 72 hour(s)).  Physical Findings: AIMS: Facial and Oral Movements Muscles of Facial Expression: None, normal Lips and Perioral Area: None, normal Jaw: None, normal Tongue: None, normal,Extremity Movements Upper (arms, wrists, hands, fingers): None, normal Lower (legs, knees, ankles, toes): None, normal, Trunk Movements Neck, shoulders, hips: None, normal, Overall Severity Severity of abnormal movements (highest score from questions above): None, normal Incapacitation due to abnormal movements: None, normal Patient's awareness of abnormal movements (rate only patient's report): No Awareness, Dental Status Current problems with teeth and/or dentures?: No Does patient usually wear dentures?: No  CIWA:  CIWA-Ar Total: 0 COWS:     Psychiatric Specialty Exam: See Psychiatric Specialty Exam and Suicide Risk Assessment completed by Attending Physician prior to discharge.  Discharge destination:  Home  Is patient on multiple  antipsychotic therapies at discharge:  No   Has Patient had three or more failed trials of antipsychotic monotherapy by history:  No  Recommended Plan for Multiple  Antipsychotic Therapies: NA     Medication List       Indication   aspirin 81 MG EC tablet  Take 1 tablet (81 mg total) by mouth daily.   Indication:  Inflammation     calcium carbonate 500 MG chewable tablet  Commonly known as:  TUMS - dosed in mg elemental calcium  Chew 1 tablet (200 mg of elemental calcium total) by mouth daily as needed for heartburn.      HYDROcodone-acetaminophen 5-325 MG per tablet  Commonly known as:  NORCO/VICODIN  Take 1 tablet by mouth every 4 (four) hours as needed for pain.      metoprolol succinate 50 MG 24 hr tablet  Commonly known as:  TOPROL-XL  Take 1 tablet (50 mg total) by mouth daily. Take with or immediately following a meal.   Indication:  High Blood Pressure     multivitamin with minerals Tabs tablet  Take 1 tablet by mouth daily.   Indication:  Vitamin Supplementation     QUEtiapine 100 MG tablet  Commonly known as:  SEROQUEL  Take 1 tablet (100 mg total) by mouth at bedtime.   Indication:  Trouble Sleeping, Improved Mood Stability     traZODone 50 MG tablet  Commonly known as:  DESYREL  Take 1 tablet (50 mg total) by mouth at bedtime as needed and may repeat dose one time if needed for sleep.   Indication:  Trouble Sleeping           Follow-up Information   Follow up with Dr. Chestine Spore [facility] On 03/12/2013. (Appt. with Dr. Audie Box at 9:30AM for hospital followup. If you have to cancel/reschedule, please call within 48 hours in order to do so or else Dr. Audie Box will not be able to see you in the future. )    Contact information:   [address]  [place], [place] [address] Phone: [phone] Fax: [phone]      Follow-up recommendations:  Activity:  as tolerated Diet:  regular Follow up Hospice/AA/therapy Comments:  He states he has a great support system at home. He will be with family who love and support him.  Total Discharge Time:  Greater than 30 minutes.  Signed: Lorie Cleckley A 03/13/2013,  8:23 PM

## 2014-08-14 IMAGING — CT CT HEAD W/O CM
2 series · 17 of 30 positions shown, 20 images · non-contrast
Comparison: 02/12/2013.

CLINICAL DATA: Lethargy after seizure.

EXAM:
CT HEAD WITHOUT CONTRAST
TECHNIQUE: Contiguous axial images were obtained from the base of the skull
through the vertex without intravenous contrast.

[Series 2: head w/o · axial · non-contrast · 0.48mm/px · z∈[-126,-6]mm · 9 of 32 slices shown, 12 images]
[im 4/32  brain]
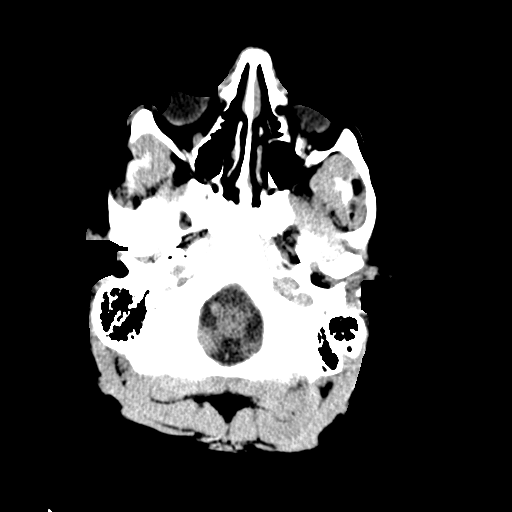
[im 4/32  bone]
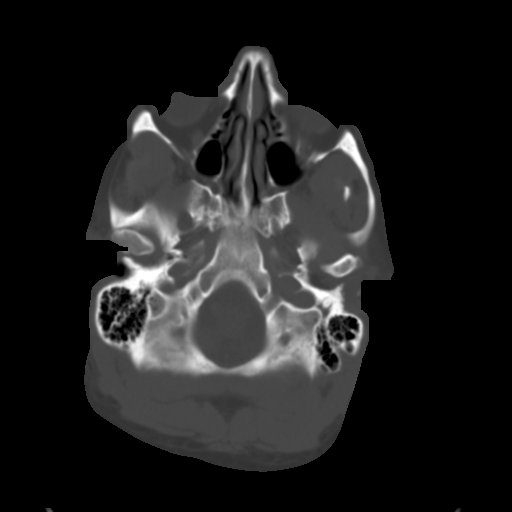
[im 7/32  brain]
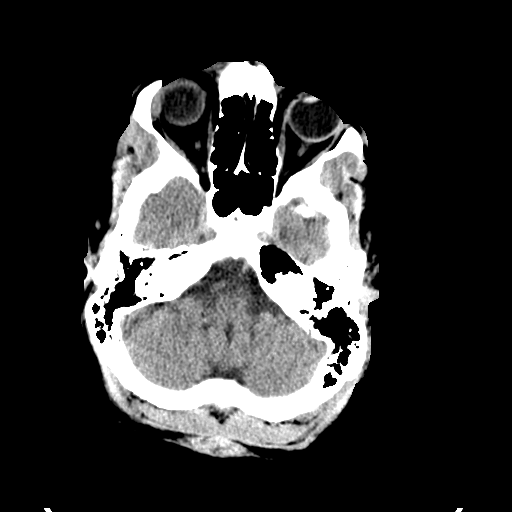
[im 10/32  brain]
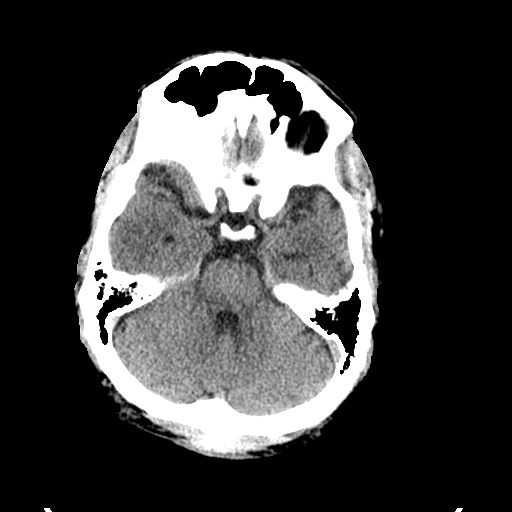
[im 13/32  brain]
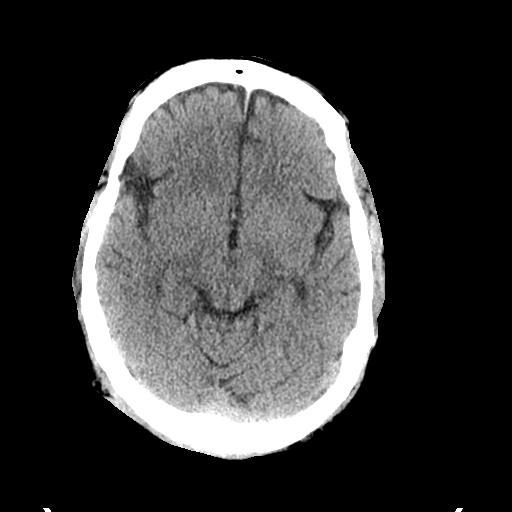
[im 16/32  brain]
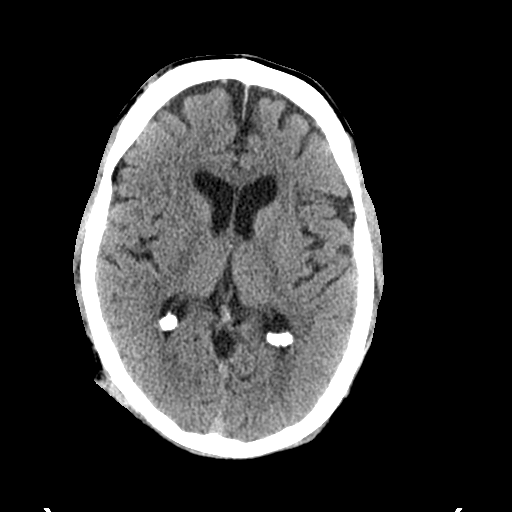
[im 16/32  bone]
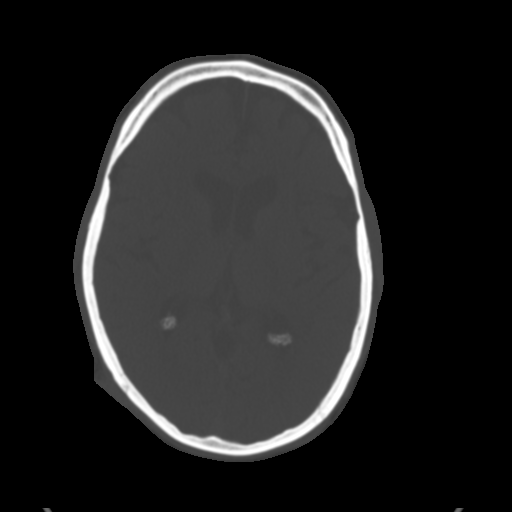
[im 19/32  brain]
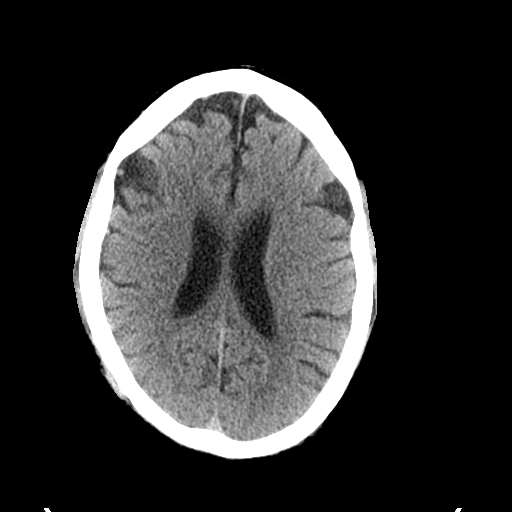
[im 22/32  brain]
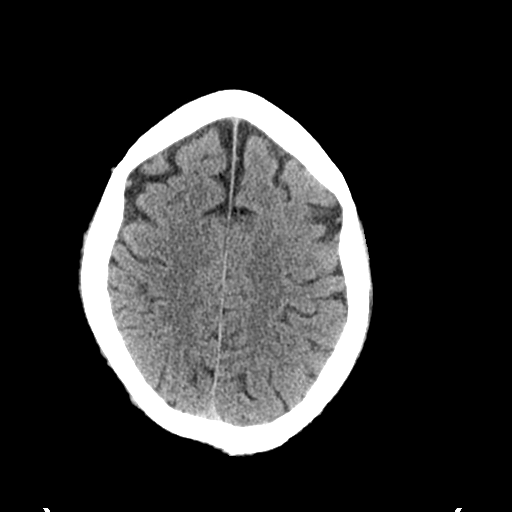
[im 25/32  brain]
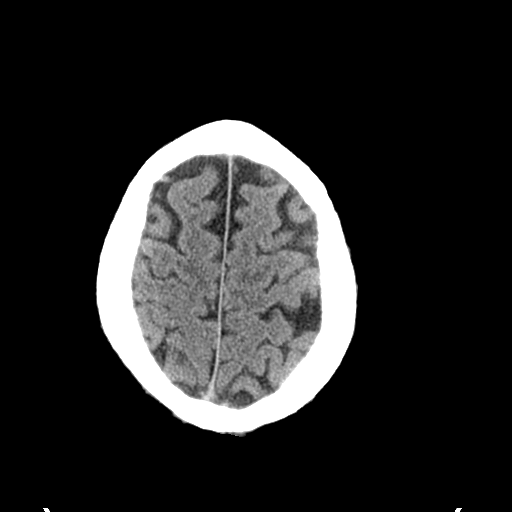
[im 28/32  brain]
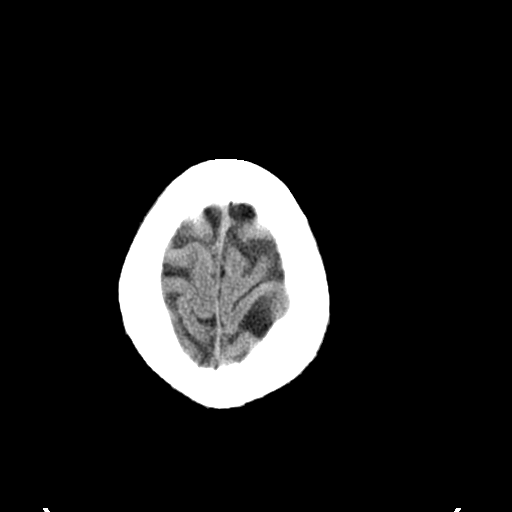
[im 28/32  bone]
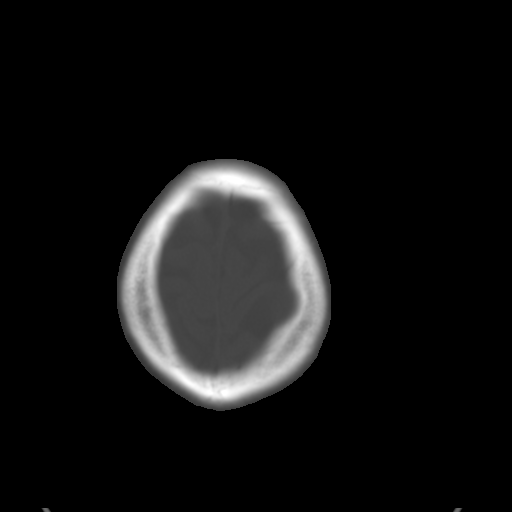

[Series 3: bone windows · axial · 0.48mm/px · z∈[-126,-3]mm · 8 of 53 slices shown]
[im 6/53  bone]
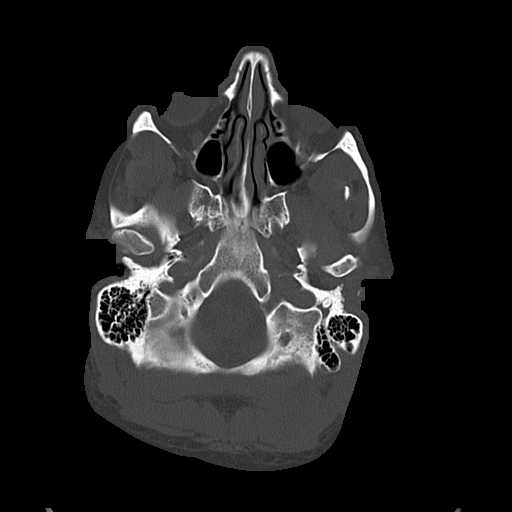
[im 12/53  bone]
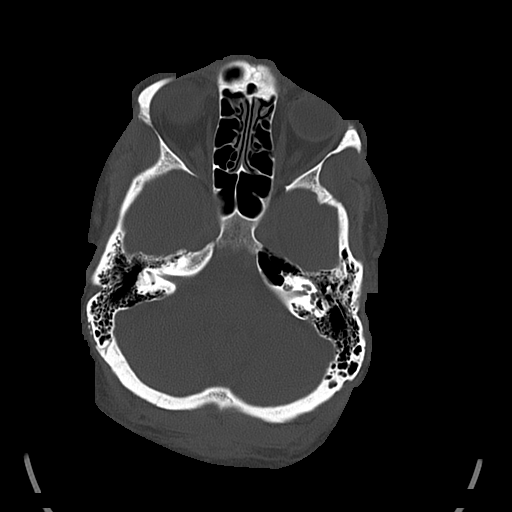
[im 18/53  bone]
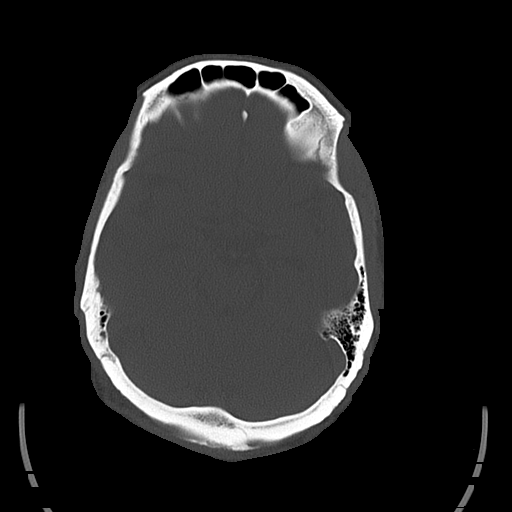
[im 24/53  bone]
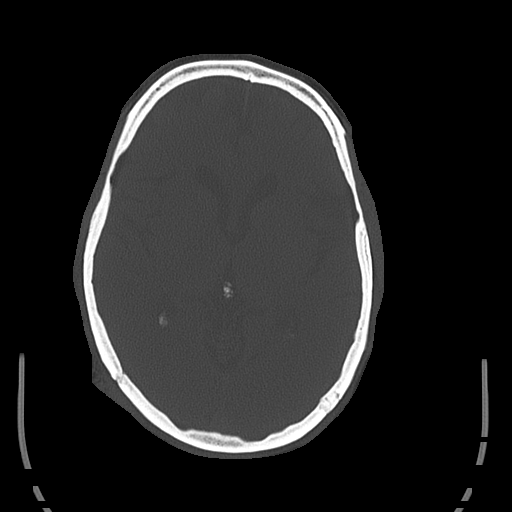
[im 29/53  bone]
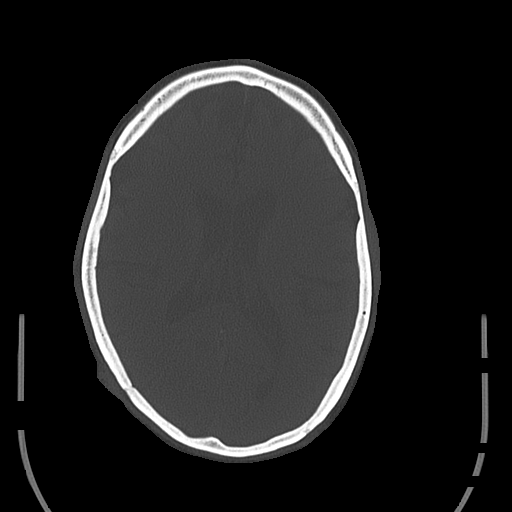
[im 35/53  bone]
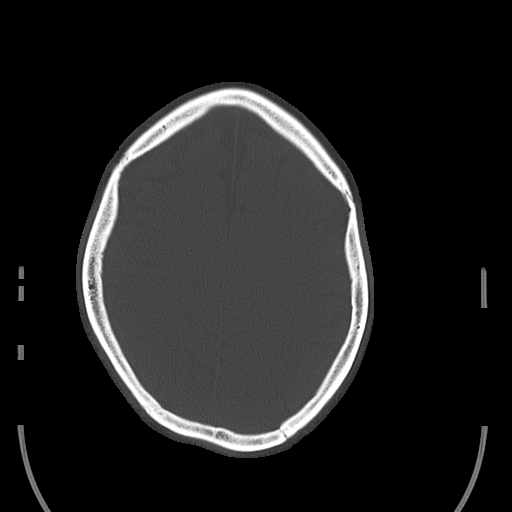
[im 41/53  bone]
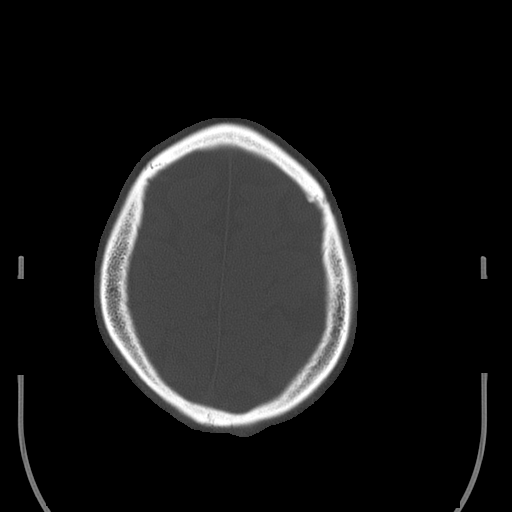
[im 47/53  bone]
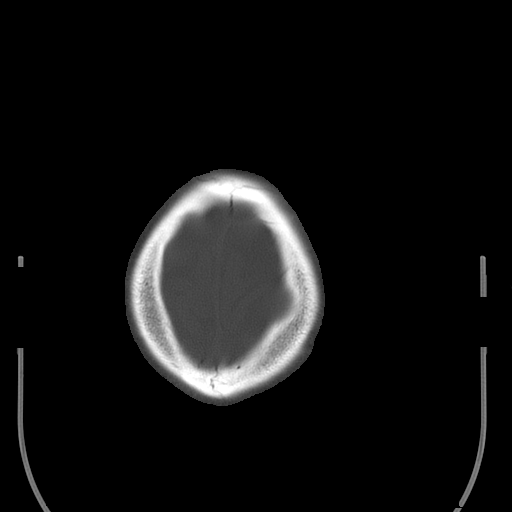

[17 of 30 positions shown; findings below may reference images not displayed]

FINDINGS: Skull:Remote nondepressed right occipital bone fracture is still
seen. Scalp swelling in the bilateral occipital regions is similar
to prior. No new osseous abnormality is detected. Probable remote
right orbital floor fracture.

Orbits: No acute abnormality.

Brain: No evidence of acute abnormality, such as acute infarction,
hemorrhage, hydrocephalus, or mass lesion/mass effect. Global
cerebral atrophy.
IMPRESSION: 1. No evidence of acute intracranial disease.
2. Global cerebral atrophy.

## 2014-08-20 IMAGING — CT CT HEAD W/O CM
1 series · 16 of 30 positions shown, 20 images · non-contrast
Comparison: 02/25/2013

CLINICAL DATA: Syncope, fell.

EXAM:
CT HEAD WITHOUT CONTRAST
TECHNIQUE: Contiguous axial images were obtained from the base of the skull
through the vertex without intravenous contrast.

[Series 2: head 5.0 h30s · axial · 0.46mm/px · z∈[-118,+17]mm · 16 of 31 slices shown, 20 images]
[im 2/31  brain]
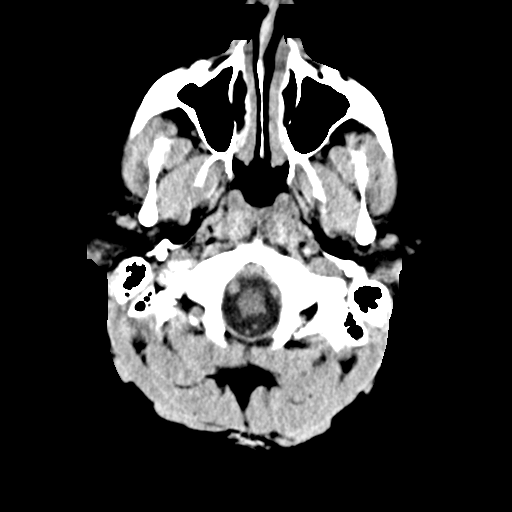
[im 2/31  bone]
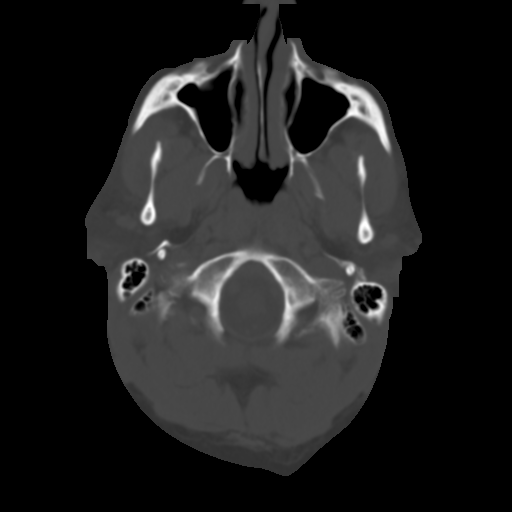
[im 4/31  brain]
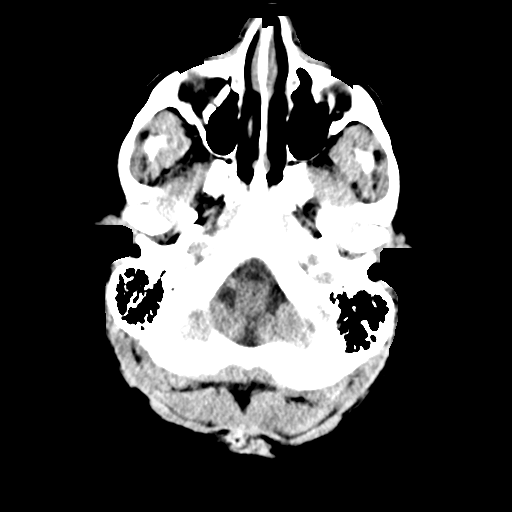
[im 6/31  brain]
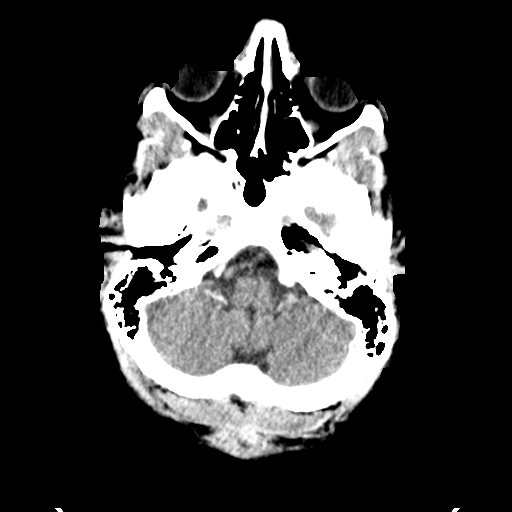
[im 8/31  brain]
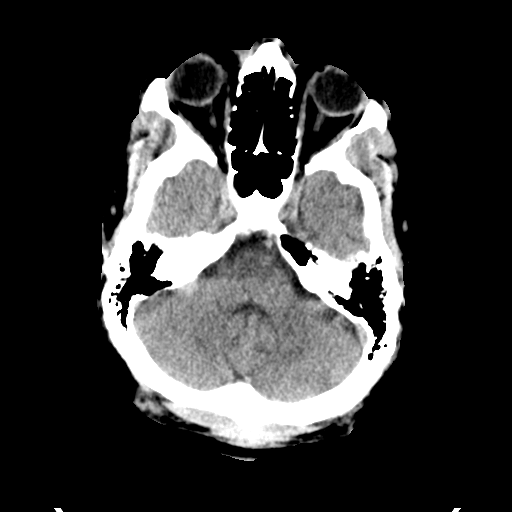
[im 9/31  brain]
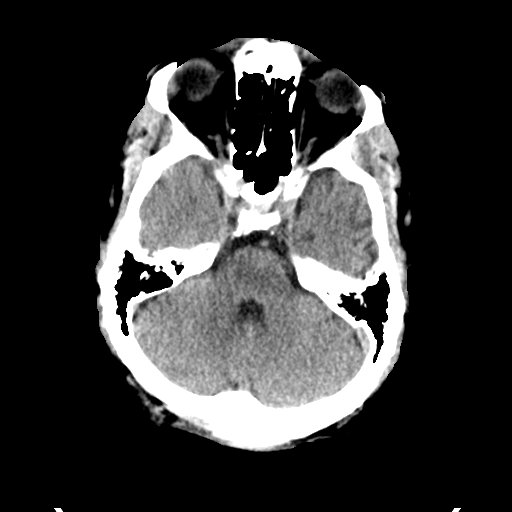
[im 9/31  bone]
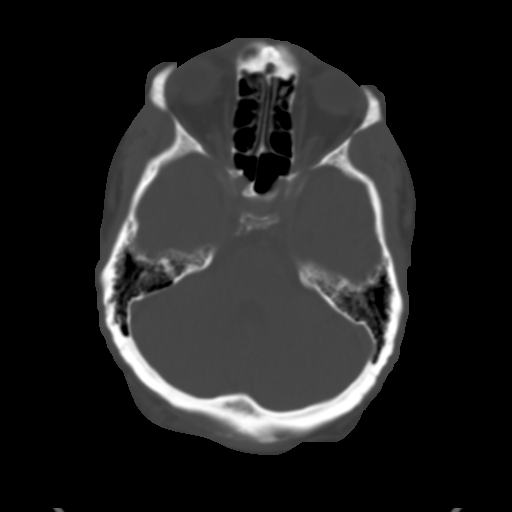
[im 11/31  brain]
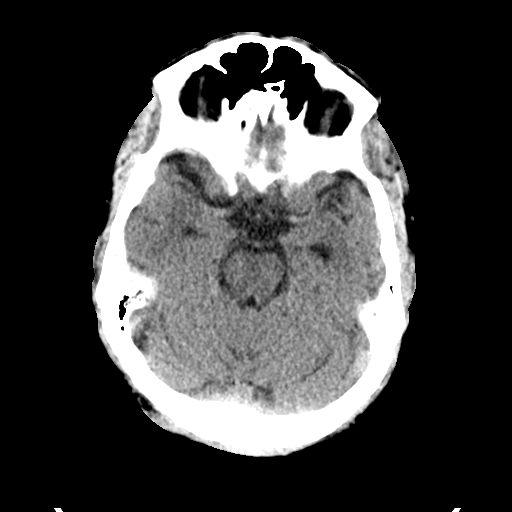
[im 13/31  brain]
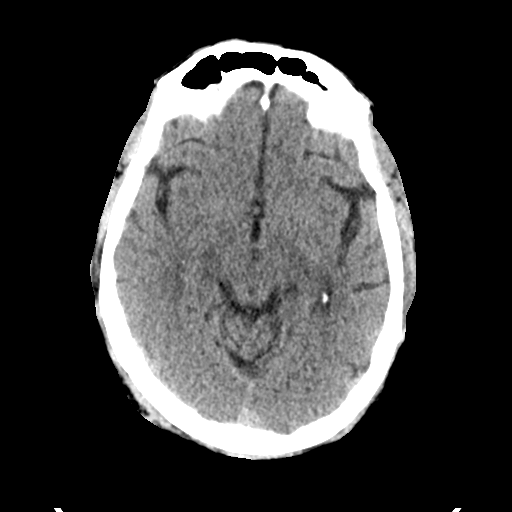
[im 15/31  brain]
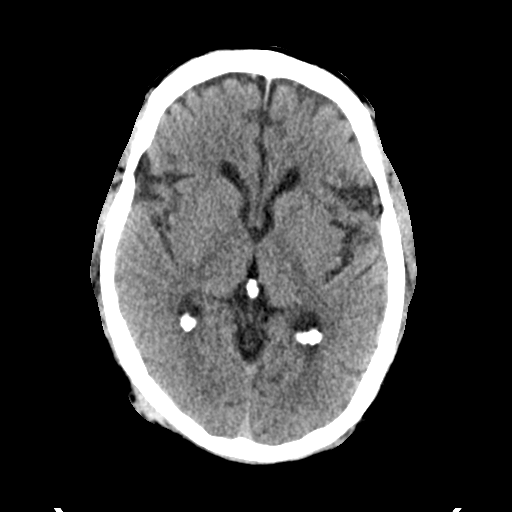
[im 16/31  brain]
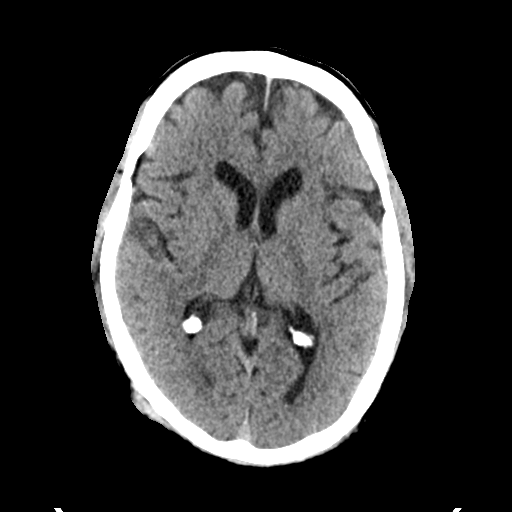
[im 16/31  bone]
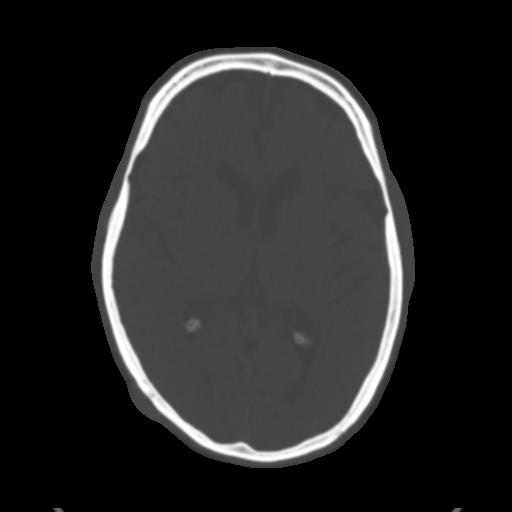
[im 18/31  brain]
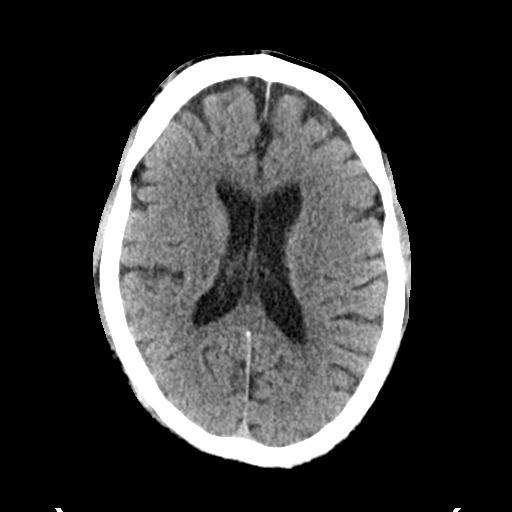
[im 20/31  brain]
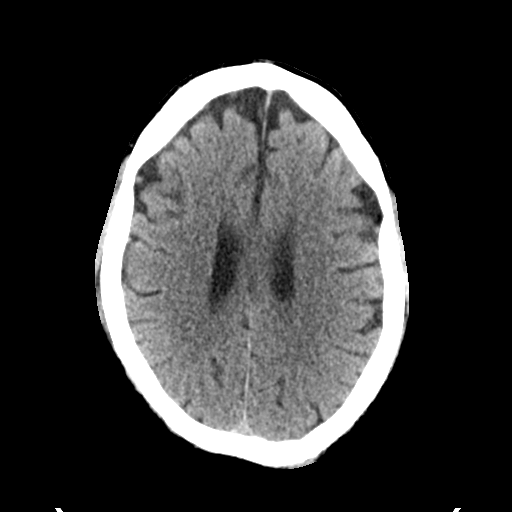
[im 22/31  brain]
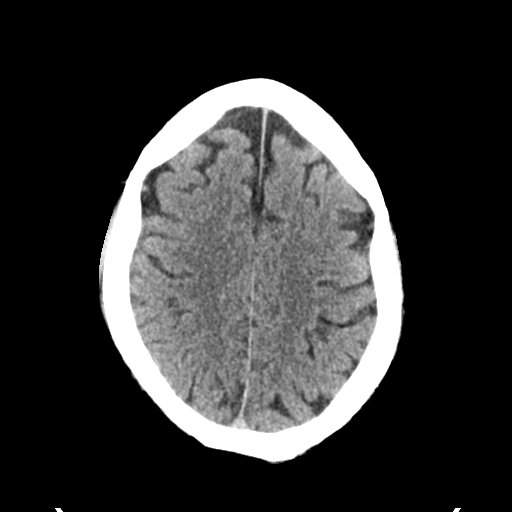
[im 23/31  brain]
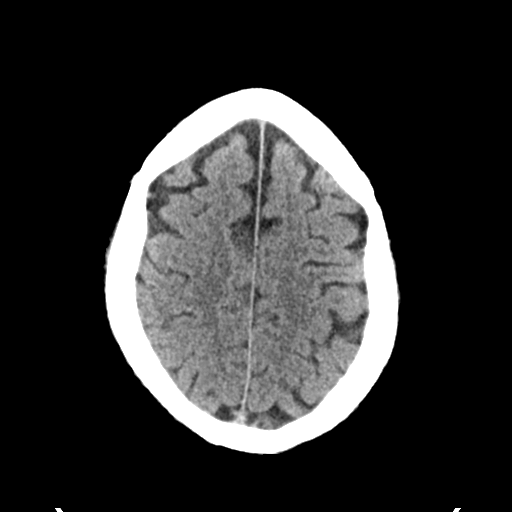
[im 23/31  bone]
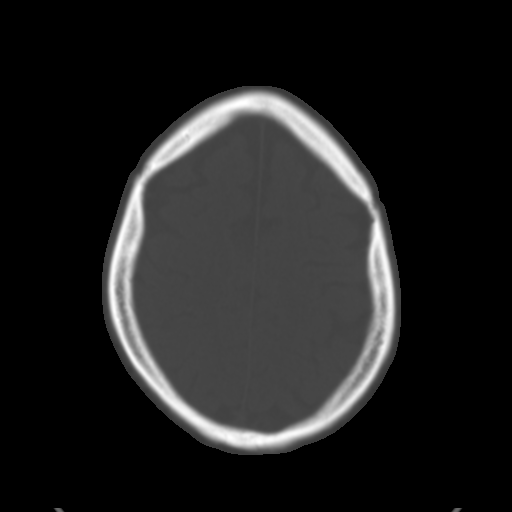
[im 25/31  brain]
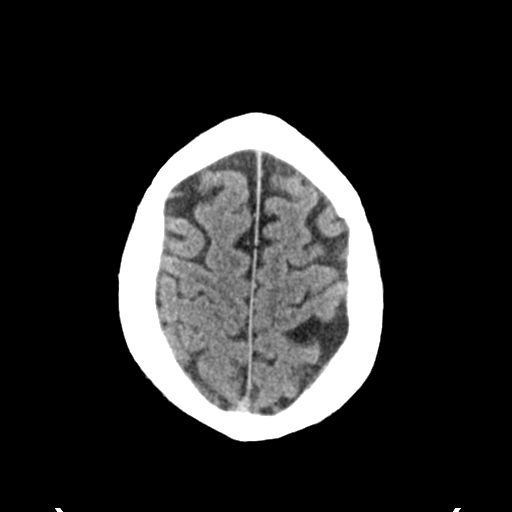
[im 27/31  brain]
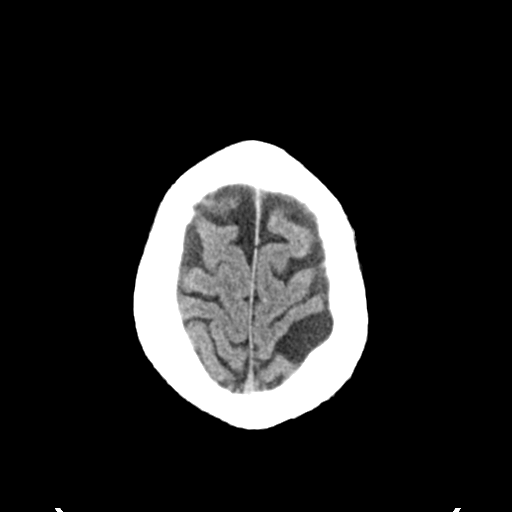
[im 29/31  brain]
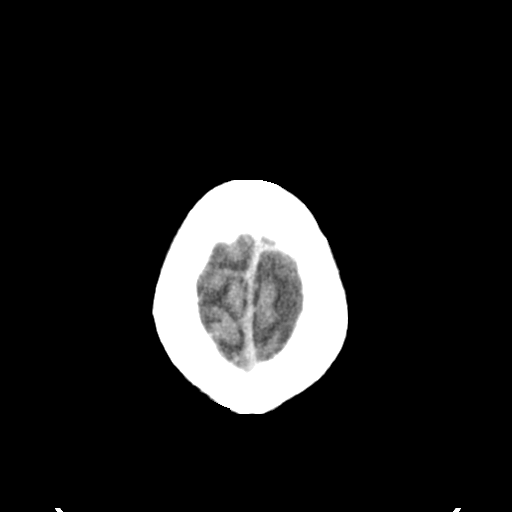

[16 of 30 positions shown; findings below may reference images not displayed]

FINDINGS: Old right orbital floor fracture deformity. Moderate diffuse
atrophy. There is no evidence of acute intracranial hemorrhage,
brain edema, mass lesion, acute infarction, mass effect, or midline
shift. Acute infarct may be in apparent on noncontrast CT. No other
intra-axial abnormalities are seen, and the ventricles and sulci are
within normal limits in size and symmetry. No abnormal extra-axial
fluid collections or masses are identified. No significant calvarial
abnormality. :
IMPRESSION: Negative for bleed or other acute intracranial process.

## 2019-04-08 ENCOUNTER — Other Ambulatory Visit: Payer: Self-pay

## 2019-04-08 ENCOUNTER — Emergency Department (HOSPITAL_COMMUNITY)
Admission: EM | Admit: 2019-04-08 | Discharge: 2019-04-09 | Disposition: A | Discharge status: Home / Self Care | Payer: Self-pay | Attending: Emergency Medicine | Admitting: Emergency Medicine

## 2019-04-08 ENCOUNTER — Encounter (HOSPITAL_COMMUNITY): Payer: Self-pay | Admitting: Emergency Medicine

## 2019-04-08 DIAGNOSIS — I10 Essential (primary) hypertension: Secondary | ICD-10-CM | POA: Insufficient documentation

## 2019-04-08 DIAGNOSIS — Z20822 Contact with and (suspected) exposure to covid-19: Secondary | ICD-10-CM

## 2019-04-08 DIAGNOSIS — R509 Fever, unspecified: Secondary | ICD-10-CM | POA: Insufficient documentation

## 2019-04-08 DIAGNOSIS — Z20828 Contact with and (suspected) exposure to other viral communicable diseases: Secondary | ICD-10-CM | POA: Insufficient documentation

## 2019-04-08 DIAGNOSIS — F17210 Nicotine dependence, cigarettes, uncomplicated: Secondary | ICD-10-CM | POA: Insufficient documentation

## 2019-04-08 DIAGNOSIS — R519 Headache, unspecified: Secondary | ICD-10-CM | POA: Insufficient documentation

## 2019-04-08 DIAGNOSIS — Z7982 Long term (current) use of aspirin: Secondary | ICD-10-CM | POA: Insufficient documentation

## 2019-04-08 DIAGNOSIS — Z79899 Other long term (current) drug therapy: Secondary | ICD-10-CM | POA: Insufficient documentation

## 2019-04-08 DIAGNOSIS — J449 Chronic obstructive pulmonary disease, unspecified: Secondary | ICD-10-CM | POA: Insufficient documentation

## 2019-04-08 LAB — CBC WITH DIFFERENTIAL/PLATELET
Abs Immature Granulocytes: 0.03 10*3/uL (ref 0.00–0.07)
Basophils Absolute: 0 10*3/uL (ref 0.0–0.1)
Basophils Relative: 0 %
Eosinophils Absolute: 0 10*3/uL (ref 0.0–0.5)
Eosinophils Relative: 0 %
HCT: 50 % (ref 39.0–52.0)
Hemoglobin: 16.9 g/dL (ref 13.0–17.0)
Immature Granulocytes: 0 %
Lymphocytes Relative: 6 %
Lymphs Abs: 0.5 10*3/uL — ABNORMAL LOW (ref 0.7–4.0)
MCH: 32.8 pg (ref 26.0–34.0)
MCHC: 33.8 g/dL (ref 30.0–36.0)
MCV: 97.1 fL (ref 80.0–100.0)
Monocytes Absolute: 0.5 10*3/uL (ref 0.1–1.0)
Monocytes Relative: 6 %
Neutro Abs: 7.9 10*3/uL — ABNORMAL HIGH (ref 1.7–7.7)
Neutrophils Relative %: 88 %
Platelets: 162 10*3/uL (ref 150–400)
RBC: 5.15 MIL/uL (ref 4.22–5.81)
RDW: 14.2 % (ref 11.5–15.5)
WBC: 9 10*3/uL (ref 4.0–10.5)
nRBC: 0 % (ref 0.0–0.2)

## 2019-04-08 LAB — BASIC METABOLIC PANEL
Anion gap: 13 (ref 5–15)
BUN: 16 mg/dL (ref 6–20)
CO2: 22 mmol/L (ref 22–32)
Calcium: 9.6 mg/dL (ref 8.9–10.3)
Chloride: 100 mmol/L (ref 98–111)
Creatinine, Ser: 1.11 mg/dL (ref 0.61–1.24)
GFR calc Af Amer: >60 mL/min (ref >60)
GFR calc non Af Amer: >60 mL/min (ref >60)
Glucose, Bld: 126 mg/dL — ABNORMAL HIGH (ref 70–99)
Potassium: 4.3 mmol/L (ref 3.5–5.1)
Sodium: 135 mmol/L (ref 135–145)

## 2019-04-08 MED ORDER — ACETAMINOPHEN 325 MG PO TABS
650.0000 mg | ORAL_TABLET | Freq: Once | ORAL | Status: AC | PRN
  Administered 2019-04-08: 650 mg via ORAL
  Filled 2019-04-08: qty 2

## 2019-04-08 NOTE — ED Triage Notes (Addendum)
Pt reports a headache, bodyaches, htn, and a fever. Pt reports this all started last night. Pt reports taking tylenol at 1100 hrs this date.

## 2019-04-09 ENCOUNTER — Emergency Department (HOSPITAL_COMMUNITY): Payer: Self-pay

## 2019-04-09 LAB — SARS CORONAVIRUS 2 (TAT 6-24 HRS): SARS Coronavirus 2: NEGATIVE

## 2019-04-09 MED ORDER — ONDANSETRON HCL 4 MG/2ML IJ SOLN
4.0000 mg | Freq: Once | INTRAMUSCULAR | Status: AC
  Administered 2019-04-09: 10:00:00 4 mg via INTRAVENOUS
  Filled 2019-04-09: qty 2

## 2019-04-09 MED ORDER — ONDANSETRON 4 MG PO TBDP
4.0000 mg | ORAL_TABLET | Freq: Once | ORAL | Status: AC
  Administered 2019-04-09: 06:00:00 4 mg via ORAL
  Filled 2019-04-09: qty 1

## 2019-04-09 MED ORDER — OXYCODONE HCL 5 MG PO TABS
5.0000 mg | ORAL_TABLET | Freq: Once | ORAL | Status: AC
  Administered 2019-04-09: 11:00:00 5 mg via ORAL
  Filled 2019-04-09: qty 1

## 2019-04-09 MED ORDER — ACETAMINOPHEN 500 MG PO TABS
1000.0000 mg | ORAL_TABLET | Freq: Once | ORAL | Status: DC
  Filled 2019-04-09: qty 2

## 2019-04-09 MED ORDER — SODIUM CHLORIDE 0.9 % IV BOLUS
1000.0000 mL | Freq: Once | INTRAVENOUS | Status: AC
  Administered 2019-04-09: 10:00:00 1000 mL via INTRAVENOUS

## 2019-04-09 MED ORDER — ACETAMINOPHEN 500 MG PO TABS
1000.0000 mg | ORAL_TABLET | Freq: Once | ORAL | Status: AC
  Administered 2019-04-09: 08:00:00 1000 mg via ORAL
  Filled 2019-04-09: qty 2

## 2019-04-09 MED ORDER — IBUPROFEN 800 MG PO TABS: 800.0000 mg | ORAL_TABLET | Freq: Three times a day (TID) | ORAL | 0 refills | Status: AC

## 2019-04-09 MED ORDER — HYDROCODONE-ACETAMINOPHEN 5-325 MG PO TABS: 1.0000 | ORAL_TABLET | ORAL | 0 refills | Status: DC | PRN

## 2019-04-09 MED ORDER — HYDROMORPHONE HCL 1 MG/ML IJ SOLN: 1.0000 mg | Freq: Once | INTRAMUSCULAR | Status: DC

## 2019-04-09 MED ORDER — IBUPROFEN 800 MG PO TABS
800.0000 mg | ORAL_TABLET | Freq: Once | ORAL | Status: AC
  Administered 2019-04-09: 08:00:00 800 mg via ORAL
  Filled 2019-04-09: qty 1

## 2019-04-09 MED ORDER — MORPHINE SULFATE (PF) 4 MG/ML IV SOLN
4.0000 mg | Freq: Once | INTRAVENOUS | Status: AC
  Administered 2019-04-09: 10:00:00 4 mg via INTRAVENOUS
  Filled 2019-04-09: qty 1

## 2019-04-09 NOTE — ED Provider Notes (Signed)
[facility] EMERGENCY DEPARTMENT Provider Note   CSN: [id number] Arrival date & time: 04/08/19  1421     History   Chief Complaint Chief Complaint  Patient presents with  . Headache  . Generalized Body Aches  . Fever  . Hypertension    HPI Robert Buck is a 58 y.o. male.     Pt presents to the ED today with headache and fever.  Pt said sx started last night.  He last took some tylenol around midnight.  Unfortunately, he had to wait in the waiting room for over 17 hours.  The pt denies any known Covid contacts.  The pt denies cough.     Past Medical History:  Diagnosis Date  . Cardiomyopathy ([place])   . COPD (chronic obstructive pulmonary disease) ([place])   . Hypertension   . Seizures ([place])   . Sleep apnea   . 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 Alcohol dependence ([place]) 03/05/2013  . Depressive disorder, not elsewhere classified 03/05/2013  . Hypertension 02/22/2013  . Chest pain 02/22/2013  . Obstructive sleep apnea on CPAP 02/22/2013  . Alcohol abuse 02/22/2013  . Tobacco abuse 02/22/2013  . History of traumatic brain injury 02/22/2013    Past Surgical History:  Procedure Laterality Date  . APPENDECTOMY    . CARDIAC CATHETERIZATION  11/2008  . LEG SURGERY          Home Medications    Prior to Admission medications   Medication Sig Start Date End Date Taking? Authorizing Provider  aspirin 81 MG EC tablet Take 1 tablet (81 mg total) by mouth daily. 03/06/13   Niel Hummer, NP  calcium carbonate (TUMS - DOSED IN MG ELEMENTAL CALCIUM) 500 MG chewable tablet Chew 1 tablet (200 mg of elemental calcium total) by mouth daily as needed for heartburn. 02/23/13   Cater, Wynelle Bourgeois, MD  HYDROcodone-acetaminophen (NORCO/VICODIN) 5-325 MG tablet Take 1 tablet by mouth every 4 (four) hours as needed. 04/09/19   Isla Pence, MD  ibuprofen (ADVIL) 800 MG tablet Take 1 tablet (800 mg  total) by mouth 3 (three) times daily. 04/09/19   Isla Pence, MD  metoprolol succinate (TOPROL-XL) 50 MG 24 hr tablet Take 1 tablet (50 mg total) by mouth daily. Take with or immediately following a meal. 03/06/13   Niel Hummer, NP  Multiple Vitamin (MULTIVITAMIN WITH MINERALS) TABS tablet Take 1 tablet by mouth daily. 03/06/13   Niel Hummer, NP  QUEtiapine (SEROQUEL) 100 MG tablet Take 1 tablet (100 mg total) by mouth at bedtime. 03/06/13   Niel Hummer, NP  traZODone (DESYREL) 50 MG tablet Take 1 tablet (50 mg total) by mouth at bedtime as needed and may repeat dose one time if needed for sleep. 03/06/13   Niel Hummer, NP    Family History Family History  Problem Relation Age of Onset  . Heart failure Father   . Diabetes Father   . Diabetes Mother   . Diabetes Brother   . Heart failure Paternal Grandfather     Social History Social History   Tobacco Use  . Smoking status: Current Every Day Smoker    Packs/day: 0.50    Types: Cigarettes  . Smokeless tobacco: Never Used  Substance Use Topics  . Alcohol use: Yes    Alcohol/week: 14.0 standard drinks    Types: 14 Cans of beer per week  Comment: will sometimes drink until he "losses count"  5 times weekly  . Drug use: Yes    Types: Marijuana     Allergies   Lisinopril   Review of Systems Review of Systems  Constitutional: Positive for fatigue and fever.  Neurological: Positive for headaches.  All other systems reviewed and are negative.    Physical Exam Updated Vital Signs BP (!) 148/80   Pulse 80   Temp (!) 101.9 F (38.8 C) (Oral)   Resp 17   Ht 5\' 9"  (1.753 m)   Wt 99.8 kg   SpO2 92%   BMI 32.49 kg/m   Physical Exam Vitals signs and nursing note reviewed.  Constitutional:      Appearance: He is well-developed.  HENT:     Head: Normocephalic and atraumatic.     Mouth/Throat:     Mouth: Mucous membranes are dry.  Eyes:     Extraocular Movements: Extraocular movements intact.      Pupils: Pupils are equal, round, and reactive to light.  Neck:     Musculoskeletal: Normal range of motion and neck supple.  Cardiovascular:     Rate and Rhythm: Normal rate and regular rhythm.     Heart sounds: Normal heart sounds.  Pulmonary:     Effort: Pulmonary effort is normal.     Breath sounds: Normal breath sounds.  Abdominal:     General: Bowel sounds are normal.     Palpations: Abdomen is soft.  Musculoskeletal: Normal range of motion.  Skin:    General: Skin is warm and dry.     Capillary Refill: Capillary refill takes less than 2 seconds.  Neurological:     Mental Status: He is alert and oriented to person, place, and time.  Psychiatric:        Mood and Affect: Mood normal.        Speech: Speech normal.        Behavior: Behavior normal.      ED Treatments / Results  Labs (all labs ordered are listed, but only abnormal results are displayed) Labs Reviewed  BASIC METABOLIC PANEL - Abnormal; Notable for the following components:      Result Value   Glucose, Bld 126 (*)    All other components within normal limits  CBC WITH DIFFERENTIAL/PLATELET - Abnormal; Notable for the following components:   Neutro Abs 7.9 (*)    Lymphs Abs 0.5 (*)    All other components within normal limits  SARS CORONAVIRUS 2 (TAT 6-24 HRS)    EKG None  Radiology Dg Chest Portable 1 View  Result Date: 04/09/2019 CLINICAL DATA:  Fever. EXAM: PORTABLE CHEST 1 VIEW COMPARISON:  February 25, 2013. FINDINGS: The heart size and mediastinal contours are within normal limits. No pneumothorax or pleural effusion is noted. Left lung is clear. Minimal right midlung subsegmental atelectasis is noted. The visualized skeletal structures are unremarkable. IMPRESSION: Minimal right midlung subsegmental atelectasis. Electronically Signed   By: Lupita RaiderJames  Green Jr M.D.   On: 04/09/2019 09:22    Procedures Procedures (including critical care time)  Medications Ordered in ED Medications   acetaminophen (TYLENOL) tablet 1,000 mg (1,000 mg Oral Not Given 04/09/19 0819)  HYDROmorphone (DILAUDID) injection 1 mg (has no administration in time range)  acetaminophen (TYLENOL) tablet 650 mg (650 mg Oral Given 04/08/19 2359)  ondansetron (ZOFRAN-ODT) disintegrating tablet 4 mg (4 mg Oral Given 04/09/19 0603)  sodium chloride 0.9 % bolus 1,000 mL (1,000 mLs Intravenous New Bag/Given 04/09/19 0954)  acetaminophen (TYLENOL) tablet 1,000 mg (1,000 mg Oral Given 04/09/19 0819)  ibuprofen (ADVIL) tablet 800 mg (800 mg Oral Given 04/09/19 0819)  morphine 4 MG/ML injection 4 mg (4 mg Intravenous Given 04/09/19 0957)  ondansetron (ZOFRAN) injection 4 mg (4 mg Intravenous Given 04/09/19 0955)     Initial Impression / Assessment and Plan / ED Course  I have reviewed the triage vital signs and the nursing notes.  Pertinent labs & imaging results that were available during my care of the patient were reviewed by me and considered in my medical decision making (see chart for details).       Pt's labs are ok.  I suspect pt has covid.  I don't think he has meningitis clinically.  Pt is told to self-isolate until his Covid swab comes back.  He is told to return if worse.  Robert Buck was evaluated in Emergency Department on 04/09/2019 for the symptoms described in the history of present illness. He was evaluated in the context of the global COVID-19 pandemic, which necessitated consideration that the patient might be at risk for infection with the SARS-CoV-2 virus that causes COVID-19. Institutional protocols and algorithms that pertain to the evaluation of patients at risk for COVID-19 are in a state of rapid change based on information released by regulatory bodies including the CDC and federal and state organizations. These policies and algorithms were followed during the patient's care in the ED.  Final Clinical Impressions(s) / ED Diagnoses   Final diagnoses:  Suspected COVID-19 virus  infection  Acute nonintractable headache, unspecified headache type  Fever in adult    ED Discharge Orders         Ordered    HYDROcodone-acetaminophen (NORCO/VICODIN) 5-325 MG tablet  Every 4 hours PRN     04/09/19 1100    ibuprofen (ADVIL) 800 MG tablet  3 times daily     04/09/19 1100           Jacalyn Lefevre, MD 04/09/19 1102

## 2019-04-09 NOTE — ED Notes (Signed)
Patient states he is nauseous. Sort [employer] examiner notified.

## 2019-04-09 NOTE — ED Notes (Signed)
Sort RN [employer] notified of temperature. States he already gave tylenol.

## 2019-04-10 ENCOUNTER — Telehealth: Payer: Self-pay | Admitting: General Practice

## 2019-04-10 NOTE — Telephone Encounter (Signed)
Negative COVID results given. Patient results "NOT Detected." Caller expressed understanding. ° °

## 2019-04-15 ENCOUNTER — Other Ambulatory Visit: Payer: Self-pay

## 2019-04-15 ENCOUNTER — Inpatient Hospital Stay (HOSPITAL_COMMUNITY)
Admission: EM | Admit: 2019-04-15 | Discharge: 2019-04-18 | DRG: 152 | Disposition: A | Discharge status: Home / Self Care | Payer: Self-pay | Attending: Internal Medicine | Admitting: Internal Medicine

## 2019-04-15 ENCOUNTER — Emergency Department (HOSPITAL_COMMUNITY): Payer: Self-pay

## 2019-04-15 DIAGNOSIS — J441 Chronic obstructive pulmonary disease with (acute) exacerbation: Secondary | ICD-10-CM | POA: Diagnosis present

## 2019-04-15 DIAGNOSIS — Z590 Homelessness: Secondary | ICD-10-CM

## 2019-04-15 DIAGNOSIS — J020 Streptococcal pharyngitis: Principal | ICD-10-CM | POA: Diagnosis present

## 2019-04-15 DIAGNOSIS — Z20828 Contact with and (suspected) exposure to other viral communicable diseases: Secondary | ICD-10-CM | POA: Diagnosis present

## 2019-04-15 DIAGNOSIS — J9601 Acute respiratory failure with hypoxia: Secondary | ICD-10-CM | POA: Diagnosis present

## 2019-04-15 DIAGNOSIS — I10 Essential (primary) hypertension: Secondary | ICD-10-CM | POA: Diagnosis present

## 2019-04-15 DIAGNOSIS — R509 Fever, unspecified: Secondary | ICD-10-CM

## 2019-04-15 DIAGNOSIS — R0902 Hypoxemia: Secondary | ICD-10-CM

## 2019-04-15 DIAGNOSIS — I429 Cardiomyopathy, unspecified: Secondary | ICD-10-CM | POA: Diagnosis present

## 2019-04-15 DIAGNOSIS — Z79899 Other long term (current) drug therapy: Secondary | ICD-10-CM

## 2019-04-15 DIAGNOSIS — J449 Chronic obstructive pulmonary disease, unspecified: Secondary | ICD-10-CM | POA: Diagnosis present

## 2019-04-15 DIAGNOSIS — Z8249 Family history of ischemic heart disease and other diseases of the circulatory system: Secondary | ICD-10-CM

## 2019-04-15 DIAGNOSIS — F17210 Nicotine dependence, cigarettes, uncomplicated: Secondary | ICD-10-CM | POA: Diagnosis present

## 2019-04-15 LAB — CBC WITH DIFFERENTIAL/PLATELET
Abs Immature Granulocytes: 0.05 10*3/uL (ref 0.00–0.07)
Basophils Absolute: 0 10*3/uL (ref 0.0–0.1)
Basophils Relative: 1 %
Eosinophils Absolute: 0.1 10*3/uL (ref 0.0–0.5)
Eosinophils Relative: 1 %
HCT: 40.6 % (ref 39.0–52.0)
Hemoglobin: 13.7 g/dL (ref 13.0–17.0)
Immature Granulocytes: 1 %
Lymphocytes Relative: 15 %
Lymphs Abs: 1.2 10*3/uL (ref 0.7–4.0)
MCH: 32.9 pg (ref 26.0–34.0)
MCHC: 33.7 g/dL (ref 30.0–36.0)
MCV: 97.6 fL (ref 80.0–100.0)
Monocytes Absolute: 0.7 10*3/uL (ref 0.1–1.0)
Monocytes Relative: 9 %
Neutro Abs: 5.6 10*3/uL (ref 1.7–7.7)
Neutrophils Relative %: 73 %
Platelets: 197 10*3/uL (ref 150–400)
RBC: 4.16 MIL/uL — ABNORMAL LOW (ref 4.22–5.81)
RDW: 14.6 % (ref 11.5–15.5)
WBC: 7.7 10*3/uL (ref 4.0–10.5)
nRBC: 0 % (ref 0.0–0.2)

## 2019-04-15 LAB — COMPREHENSIVE METABOLIC PANEL
ALT: 20 U/L (ref 0–44)
AST: 29 U/L (ref 15–41)
Albumin: 3.4 g/dL — ABNORMAL LOW (ref 3.5–5.0)
Alkaline Phosphatase: 83 U/L (ref 38–126)
Anion gap: 10 (ref 5–15)
BUN: 13 mg/dL (ref 6–20)
CO2: 24 mmol/L (ref 22–32)
Calcium: 8.9 mg/dL (ref 8.9–10.3)
Chloride: 101 mmol/L (ref 98–111)
Creatinine, Ser: 0.86 mg/dL (ref 0.61–1.24)
GFR calc Af Amer: >60 mL/min (ref >60)
GFR calc non Af Amer: >60 mL/min (ref >60)
Glucose, Bld: 113 mg/dL — ABNORMAL HIGH (ref 70–99)
Potassium: 3.6 mmol/L (ref 3.5–5.1)
Sodium: 135 mmol/L (ref 135–145)
Total Bilirubin: 0.7 mg/dL (ref 0.3–1.2)
Total Protein: 6.8 g/dL (ref 6.5–8.1)

## 2019-04-15 MED ORDER — METHYLPREDNISOLONE SODIUM SUCC 125 MG IJ SOLR
125.0000 mg | Freq: Once | INTRAMUSCULAR | Status: AC
  Administered 2019-04-15: 20:00:00 125 mg via INTRAVENOUS
  Filled 2019-04-15: qty 2

## 2019-04-15 MED ORDER — ACETAMINOPHEN 325 MG PO TABS
650.0000 mg | ORAL_TABLET | Freq: Once | ORAL | Status: AC
  Administered 2019-04-15: 20:00:00 650 mg via ORAL
  Filled 2019-04-15: qty 2

## 2019-04-15 MED ORDER — OXYCODONE-ACETAMINOPHEN 5-325 MG PO TABS
1.0000 | ORAL_TABLET | Freq: Once | ORAL | Status: AC
  Administered 2019-04-15: 20:00:00 1 via ORAL
  Filled 2019-04-15: qty 1

## 2019-04-15 MED ORDER — ALBUTEROL SULFATE HFA 108 (90 BASE) MCG/ACT IN AERS
6.0000 | INHALATION_SPRAY | Freq: Once | RESPIRATORY_TRACT | Status: AC
  Administered 2019-04-15: 20:00:00 6 via RESPIRATORY_TRACT
  Filled 2019-04-15: qty 6.7

## 2019-04-15 MED ORDER — SODIUM CHLORIDE 0.9 % IV BOLUS
1000.0000 mL | Freq: Once | INTRAVENOUS | Status: AC
  Administered 2019-04-15: 20:00:00 1000 mL via INTRAVENOUS

## 2019-04-15 NOTE — ED Triage Notes (Signed)
Pt from home.  Pt reports feeling very weak, with fevers, a headache, and shortness of breath.  Pt stated his nephew tested positive for COVID today.  Pt was in recent contact with his nephew's mom, the day before yesterday.  Pt has had a fever for 9 days.

## 2019-04-16 ENCOUNTER — Inpatient Hospital Stay (HOSPITAL_COMMUNITY): Payer: Self-pay

## 2019-04-16 ENCOUNTER — Encounter (HOSPITAL_COMMUNITY): Payer: Self-pay

## 2019-04-16 DIAGNOSIS — R509 Fever, unspecified: Secondary | ICD-10-CM

## 2019-04-16 DIAGNOSIS — J9601 Acute respiratory failure with hypoxia: Secondary | ICD-10-CM | POA: Diagnosis present

## 2019-04-16 LAB — CBC WITH DIFFERENTIAL/PLATELET
Abs Immature Granulocytes: 0.05 10*3/uL (ref 0.00–0.07)
Basophils Absolute: 0 10*3/uL (ref 0.0–0.1)
Basophils Relative: 0 %
Eosinophils Absolute: 0 10*3/uL (ref 0.0–0.5)
Eosinophils Relative: 0 %
HCT: 44.3 % (ref 39.0–52.0)
Hemoglobin: 14.6 g/dL (ref 13.0–17.0)
Immature Granulocytes: 1 %
Lymphocytes Relative: 10 %
Lymphs Abs: 0.8 10*3/uL (ref 0.7–4.0)
MCH: 32.4 pg (ref 26.0–34.0)
MCHC: 33 g/dL (ref 30.0–36.0)
MCV: 98.4 fL (ref 80.0–100.0)
Monocytes Absolute: 0.1 10*3/uL (ref 0.1–1.0)
Monocytes Relative: 2 %
Neutro Abs: 7.2 10*3/uL (ref 1.7–7.7)
Neutrophils Relative %: 87 %
Platelets: 238 10*3/uL (ref 150–400)
RBC: 4.5 MIL/uL (ref 4.22–5.81)
RDW: 14.5 % (ref 11.5–15.5)
WBC: 8.2 10*3/uL (ref 4.0–10.5)
nRBC: 0 % (ref 0.0–0.2)

## 2019-04-16 LAB — URINALYSIS, ROUTINE W REFLEX MICROSCOPIC
Bilirubin Urine: NEGATIVE
Glucose, UA: NEGATIVE mg/dL
Hgb urine dipstick: NEGATIVE
Ketones, ur: NEGATIVE mg/dL
Leukocytes,Ua: NEGATIVE
Nitrite: NEGATIVE
Protein, ur: NEGATIVE mg/dL
Specific Gravity, Urine: 1.02 (ref 1.005–1.030)
pH: 5 (ref 5.0–8.0)

## 2019-04-16 LAB — COMPREHENSIVE METABOLIC PANEL
ALT: 17 U/L (ref 0–44)
AST: 21 U/L (ref 15–41)
Albumin: 3.4 g/dL — ABNORMAL LOW (ref 3.5–5.0)
Alkaline Phosphatase: 85 U/L (ref 38–126)
Anion gap: 8 (ref 5–15)
BUN: 14 mg/dL (ref 6–20)
CO2: 24 mmol/L (ref 22–32)
Calcium: 8.9 mg/dL (ref 8.9–10.3)
Chloride: 104 mmol/L (ref 98–111)
Creatinine, Ser: 0.77 mg/dL (ref 0.61–1.24)
GFR calc Af Amer: >60 mL/min (ref >60)
GFR calc non Af Amer: >60 mL/min (ref >60)
Glucose, Bld: 164 mg/dL — ABNORMAL HIGH (ref 70–99)
Potassium: 3.9 mmol/L (ref 3.5–5.1)
Sodium: 136 mmol/L (ref 135–145)
Total Bilirubin: 0.8 mg/dL (ref 0.3–1.2)
Total Protein: 7 g/dL (ref 6.5–8.1)

## 2019-04-16 LAB — HIV ANTIBODY (ROUTINE TESTING W REFLEX): HIV Screen 4th Generation wRfx: NONREACTIVE

## 2019-04-16 LAB — RESPIRATORY PANEL BY PCR

## 2019-04-16 LAB — SARS CORONAVIRUS 2 (TAT 6-24 HRS)
SARS Coronavirus 2: NEGATIVE
SARS Coronavirus 2: NEGATIVE

## 2019-04-16 LAB — C-REACTIVE PROTEIN: CRP: 4.3 mg/dL — ABNORMAL HIGH (ref <1.0)

## 2019-04-16 MED ORDER — DEXAMETHASONE SODIUM PHOSPHATE 10 MG/ML IJ SOLN: 10.0000 mg | Freq: Once | INTRAMUSCULAR | Status: DC

## 2019-04-16 MED ORDER — ALBUTEROL SULFATE HFA 108 (90 BASE) MCG/ACT IN AERS
2.0000 | INHALATION_SPRAY | Freq: Two times a day (BID) | RESPIRATORY_TRACT | Status: DC
  Administered 2019-04-17 – 2019-04-18 (×3): 2 via RESPIRATORY_TRACT
  Filled 2019-04-16: qty 6.7

## 2019-04-16 MED ORDER — TRAMADOL HCL 50 MG PO TABS
50.0000 mg | ORAL_TABLET | Freq: Four times a day (QID) | ORAL | Status: AC | PRN
  Administered 2019-04-16 – 2019-04-18 (×3): 50 mg via ORAL
  Filled 2019-04-16 (×4): qty 1

## 2019-04-16 MED ORDER — ACETAMINOPHEN 325 MG PO TABS
650.0000 mg | ORAL_TABLET | Freq: Four times a day (QID) | ORAL | Status: DC | PRN
  Administered 2019-04-16 – 2019-04-18 (×5): 650 mg via ORAL
  Filled 2019-04-16 (×5): qty 2

## 2019-04-16 MED ORDER — SODIUM CHLORIDE (PF) 0.9 % IJ SOLN
INTRAMUSCULAR | Status: AC
  Filled 2019-04-16: qty 50

## 2019-04-16 MED ORDER — ONDANSETRON HCL 4 MG/2ML IJ SOLN: 4.0000 mg | Freq: Four times a day (QID) | INTRAMUSCULAR | Status: DC | PRN

## 2019-04-16 MED ORDER — IOHEXOL 350 MG/ML SOLN
100.0000 mL | Freq: Once | INTRAVENOUS | Status: AC | PRN
  Administered 2019-04-16: 06:00:00 100 mL via INTRAVENOUS

## 2019-04-16 MED ORDER — HYDROCODONE-ACETAMINOPHEN 5-325 MG PO TABS
1.0000 | ORAL_TABLET | Freq: Once | ORAL | Status: AC
  Administered 2019-04-16: 1 via ORAL
  Filled 2019-04-16: qty 1

## 2019-04-16 MED ORDER — ALBUTEROL SULFATE HFA 108 (90 BASE) MCG/ACT IN AERS
2.0000 | INHALATION_SPRAY | RESPIRATORY_TRACT | Status: DC | PRN
  Filled 2019-04-16: qty 6.7

## 2019-04-16 MED ORDER — ALBUTEROL SULFATE HFA 108 (90 BASE) MCG/ACT IN AERS
2.0000 | INHALATION_SPRAY | RESPIRATORY_TRACT | Status: DC
  Administered 2019-04-16 (×4): 2 via RESPIRATORY_TRACT
  Filled 2019-04-16: qty 6.7

## 2019-04-16 MED ORDER — IOHEXOL 350 MG/ML SOLN
100.0000 mL | Freq: Once | INTRAVENOUS | Status: AC | PRN
  Administered 2019-04-16: 10:00:00 100 mL via INTRAVENOUS

## 2019-04-16 MED ORDER — LACTATED RINGERS IV SOLN
INTRAVENOUS | Status: AC
  Administered 2019-04-16 (×2): via INTRAVENOUS

## 2019-04-16 MED ORDER — ONDANSETRON HCL 4 MG PO TABS
4.0000 mg | ORAL_TABLET | Freq: Four times a day (QID) | ORAL | Status: DC | PRN
  Administered 2019-04-17: 4 mg via ORAL
  Filled 2019-04-16: qty 1

## 2019-04-16 MED ORDER — ACETAMINOPHEN 650 MG RE SUPP: 650.0000 mg | Freq: Four times a day (QID) | RECTAL | Status: DC | PRN

## 2019-04-16 NOTE — ED Notes (Signed)
MD Hal Hope made aware of trending HR of 50's and reaching 48. EKG done and shown to [place].

## 2019-04-16 NOTE — ED Notes (Addendum)
Pt ambulated in room on O2.  Pt was at 93% for the most part and then dropped down to 77% and then when he sat down it jumped right back to 92%.  This writer is not sure how true the 77% O2 sat was, the wave was abnormal.  Pt was slightly short of breath and still feels weak.  He had an abnormal gait while walking but pt said that is how he walks when he first gets up.

## 2019-04-16 NOTE — H&P (Signed)
History and Physical    Robert Buck ZOX:[id number] DOB: December 05, 1960 DOA: 04/15/2019  PCP: Patient, No Pcp Per  Patient coming from: Home.  Chief Complaint: Shortness of breath headache fever chills.  HPI: Robert Dolinimothy W Ticas is a 58 y.o. male with history of hypertension COPD presently not taking medications presents to the ER for the second time in last 5 days with complaints of headache generalized body ache shortness of breath nonproductive cough.  On October 26 patient was checked for COVID-19 was negative.  Patient states his nephew has had COVID-19 but he was not directly exposed though he was exposed to the nephew's mom.  Denies nausea vomiting or diarrhea but has been feeling weak and fatigued.  ED Course: In the ER patient had a fever of 102.2 F heart rate was in the 50s mildly bradycardic.  EKG shows sinus bradycardia heart rate around 50 bpm.  Labs show WBC count of 7.7 hemoglobin 13.7 platelets 197 creatinine 1.86 LFTs normal chest x-ray unremarkable.  Patient admitted for further observation.  In the ER patient was found to be mildly wheezy and was given methylprednisolone and albuterol.  Review of Systems: As per HPI, rest all negative.   Past Medical History:  Diagnosis Date  . Cardiomyopathy ([facility])   . COPD (chronic obstructive pulmonary disease) ([facility])   . Hypertension   . Seizures ([facility])   . Sleep apnea   . Traumatic brain injury [facility](HCC)    2013    Past Surgical History:  Procedure Laterality Date  . APPENDECTOMY    . CARDIAC CATHETERIZATION  11/2008  . LEG SURGERY       reports that he has been smoking cigarettes. He has been smoking about 0.50 packs per day. He has never used smokeless tobacco. He reports current alcohol use of about 14.0 standard drinks of alcohol per week. He reports current drug use. Drug: Marijuana.  Allergies  Allergen Reactions  . Lisinopril Cough    Family History  Problem Relation Age of Onset  . Heart failure Father   .  Diabetes Father   . Diabetes Mother   . Diabetes Brother   . Heart failure Paternal Grandfather     Prior to Admission medications   Medication Sig Start Date End Date Taking? Authorizing Provider  calcium carbonate (TUMS - DOSED IN MG ELEMENTAL CALCIUM) 500 MG chewable tablet Chew 1 tablet (200 mg of elemental calcium total) by mouth daily as needed for heartburn. 02/23/13  Yes Cater, Luis AbedSarah W, MD  ibuprofen (ADVIL) 200 MG tablet Take 200 mg by mouth every 6 (six) hours as needed for fever or moderate pain.   Yes [provider]  Multiple Vitamin (MULTIVITAMIN WITH MINERALS) TABS tablet Take 1 tablet by mouth daily. 03/06/13  Yes Thermon Leylandavis, Laura A, NP  traZODone (DESYREL) 50 MG tablet Take 1 tablet (50 mg total) by mouth at bedtime as needed and may repeat dose one time if needed for sleep. 03/06/13  Yes Thermon Leylandavis, Laura A, NP  aspirin 81 MG EC tablet Take 1 tablet (81 mg total) by mouth daily. Patient not taking: Reported on 04/15/2019 03/06/13   Thermon Leylandavis, Laura A, NP  HYDROcodone-acetaminophen (NORCO/VICODIN) 5-325 MG tablet Take 1 tablet by mouth every 4 (four) hours as needed. Patient not taking: Reported on 04/15/2019 04/09/19   Jacalyn LefevreHaviland, Julie, MD  ibuprofen (ADVIL) 800 MG tablet Take 1 tablet (800 mg total) by mouth 3 (three) times daily. Patient not taking: Reported on 04/15/2019 04/09/19   Jacalyn LefevreHaviland, Julie,  MD  metoprolol succinate (TOPROL-XL) 50 MG 24 hr tablet Take 1 tablet (50 mg total) by mouth daily. Take with or immediately following a meal. Patient not taking: Reported on 04/15/2019 03/06/13   Niel Hummer, NP  QUEtiapine (SEROQUEL) 100 MG tablet Take 1 tablet (100 mg total) by mouth at bedtime. Patient not taking: Reported on 04/15/2019 03/06/13   Niel Hummer, NP    Physical Exam: Constitutional: Moderately built and nourished. Vitals:   04/16/19 0344 04/16/19 0353 04/16/19 0400 04/16/19 0528  BP: 123/80 (!) 131/98 131/72 (!) 141/88  Pulse:  (!) 51  (!) 48  Resp: 18 16 18 17    Temp:      TempSrc:      SpO2: 92% 95% 93% 97%  Weight:      Height:       Eyes: Anicteric no pallor. ENMT: No discharge from the ears eyes nose or mouth. Neck: No mass felt.  No neck rigidity. Respiratory: No rhonchi or crepitations. Cardiovascular: S1-S2 heard. Abdomen: Soft nontender bowel sounds present. Musculoskeletal: No edema. Skin: No rash. Neurologic: Alert awake oriented to time place and person.  Moves all extremities. Psychiatric: Appears normal per normal affect.   Labs on Admission: I have personally reviewed following labs and imaging studies  CBC: Recent Labs  Lab 04/15/19 2015  WBC 7.7  NEUTROABS 5.6  HGB 13.7  HCT 40.6  MCV 97.6  PLT 314   Basic Metabolic Panel: Recent Labs  Lab 04/15/19 2015  NA 135  K 3.6  CL 101  CO2 24  GLUCOSE 113*  BUN 13  CREATININE 0.86  CALCIUM 8.9   GFR: Estimated Creatinine Clearance: 113.4 mL/min (by C-G formula based on SCr of 0.86 mg/dL). Liver Function Tests: Recent Labs  Lab 04/15/19 2015  AST 29  ALT 20  ALKPHOS 83  BILITOT 0.7  PROT 6.8  ALBUMIN 3.4*   No results for input(s): LIPASE, AMYLASE in the last 168 hours. No results for input(s): AMMONIA in the last 168 hours. Coagulation Profile: No results for input(s): INR, PROTIME in the last 168 hours. Cardiac Enzymes: No results for input(s): CKTOTAL, CKMB, CKMBINDEX, TROPONINI in the last 168 hours. BNP (last 3 results) No results for input(s): PROBNP in the last 8760 hours. HbA1C: No results for input(s): HGBA1C in the last 72 hours. CBG: No results for input(s): GLUCAP in the last 168 hours. Lipid Profile: No results for input(s): CHOL, HDL, LDLCALC, TRIG, CHOLHDL, LDLDIRECT in the last 72 hours. Thyroid Function Tests: No results for input(s): TSH, T4TOTAL, FREET4, T3FREE, THYROIDAB in the last 72 hours. Anemia Panel: No results for input(s): VITAMINB12, FOLATE, FERRITIN, TIBC, IRON, RETICCTPCT in the last 72 hours. Urine analysis:     Component Value Date/Time   COLORURINE YELLOW 04/15/2019 [place] 04/15/2019 2315   LABSPEC 1.020 04/15/2019 2315   PHURINE 5.0 04/15/2019 2315   GLUCOSEU NEGATIVE 04/15/2019 2315   HGBUR NEGATIVE 04/15/2019 2315   BILIRUBINUR NEGATIVE 04/15/2019 2315   KETONESUR NEGATIVE 04/15/2019 2315   PROTEINUR NEGATIVE 04/15/2019 2315   UROBILINOGEN 1.0 02/25/2013 0426   NITRITE NEGATIVE 04/15/2019 2315   LEUKOCYTESUR NEGATIVE 04/15/2019 2315   Sepsis Labs: @LABRCNTIP (procalcitonin:4,lacticidven:4) ) Recent Results (from the past 240 hour(s))  SARS CORONAVIRUS 2 (TAT 6-24 HRS) Nasopharyngeal Nasopharyngeal Swab     Status: None   Collection Time: 04/09/19  8:40 AM   Specimen: Nasopharyngeal Swab  Result Value Ref Range Status   SARS Coronavirus 2 NEGATIVE NEGATIVE Final  Comment: (NOTE) SARS-CoV-2 target nucleic acids are NOT DETECTED. The SARS-CoV-2 RNA is generally detectable in upper and lower respiratory specimens during the acute phase of infection. Negative results do not preclude SARS-CoV-2 infection, do not rule out co-infections with other pathogens, and should not be used as the sole basis for treatment or other patient management decisions. Negative results must be combined with clinical observations, patient history, and epidemiological information. The expected result is Negative. Fact Sheet for Patients: HairSlick.no Fact Sheet for Healthcare Providers: quierodirigir.com This test is not yet approved or cleared by the [place] FDA and  has been authorized for detection and/or diagnosis of SARS-CoV-2 by FDA under an Emergency Use Authorization (EUA). This EUA will remain  in effect (meaning this test can be used) for the duration of the COVID-19 declaration under Section 56 4(b)(1) of the Act, 21 U.S.C. section 360bbb-3(b)(1), unless the authorization is terminated or revoked sooner. Performed  at [facility], [address], [place], [place] [address]   SARS CORONAVIRUS 2 (TAT 6-24 HRS) Nasopharyngeal Nasopharyngeal Swab     Status: None   Collection Time: 04/15/19  7:09 PM   Specimen: Nasopharyngeal Swab  Result Value Ref Range Status   SARS Coronavirus 2 NEGATIVE NEGATIVE Final    Comment: (NOTE) SARS-CoV-2 target nucleic acids are NOT DETECTED. The SARS-CoV-2 RNA is generally detectable in upper and lower respiratory specimens during the acute phase of infection. Negative results do not preclude SARS-CoV-2 infection, do not rule out co-infections with other pathogens, and should not be used as the sole basis for treatment or other patient management decisions. Negative results must be combined with clinical observations, patient history, and epidemiological information. The expected result is Negative. Fact Sheet for Patients: HairSlick.no Fact Sheet for Healthcare Providers: quierodirigir.com This test is not yet approved or cleared by the [place] FDA and  has been authorized for detection and/or diagnosis of SARS-CoV-2 by FDA under an Emergency Use Authorization (EUA). This EUA will remain  in effect (meaning this test can be used) for the duration of the COVID-19 declaration under Section 56 4(b)(1) of the Act, 21 U.S.C. section 360bbb-3(b)(1), unless the authorization is terminated or revoked sooner. Performed at [facility], [address], [place], [place] [address]      Radiological Exams on Admission: Dg Chest Port 1 View  Result Date: 04/15/2019 CLINICAL DATA:  Shortness of breath EXAM: PORTABLE CHEST 1 VIEW COMPARISON:  04/09/2019 FINDINGS: Heart and mediastinal contours are within normal limits. No focal opacities or effusions. No acute bony abnormality. IMPRESSION: No active disease. Electronically Signed   By: Charlett Nose M.D.   On: 04/15/2019 19:37    EKG: Independently  reviewed.  Sinus bradycardia heart rate around 50 bpm.  Assessment/Plan Principal Problem:   Acute respiratory failure with hypoxia (HCC)    1. Acute respiratory failure hypoxia with flulike illness COVID-19 test initially was negative since patient has potential exposure to COVID-19 I have repeated the test along with respiratory viral panel.  We will keep patient on as needed albuterol inhaler for now and if COVID-19 come back negative we can order albuterol nebulizer.  CT angiogram of the chest is pending. 2. Headache neck pain could be from viral source.  Does not have any definite signs of encephalitis or meningitis.  I have ordered CT head and CT neck.  Closely follow. 3. History of COPD was initially given albuterol inhaler and IV steroids.  At the time of my exam  patient is not any more wheezing.  If patient does wheeze may consider further dose of steroids.  For now I have only placed patient on as needed albuterol inhaler which can be changed to nebulizer if repeat COVID-19 test is negative. 4. History of hypertension presently not on any medications.  Closely follow blood pressure trends.  Given that patient has respiratory failure chest tightness and flulike illness will need more than 2 midnight stay in inpatient status.   DVT prophylaxis: SCDs until we have results of CAT scan. Code Status: Full code. Family Communication: Discussed with patient. Disposition Plan: Home. Consults called: None. Admission status: Inpatient.   Eduard Clos MD [facility] Pager [phone].  If 7PM-7AM, please contact night-coverage [email or web address] Password [facility]  04/16/2019, 5:39 AM

## 2019-04-16 NOTE — ED Provider Notes (Signed)
[place] DEPT Provider Note   CSN: [id number] Arrival date & time: 04/15/19  1815     History   Chief Complaint No chief complaint on file.   HPI Robert Buck is a 58 y.o. male.     Patient has a history of cardiomyopathy COPD hypertension.  He complains of fever and shortness of breath and fatigue  The history is provided by the patient. No language interpreter was used.  Shortness of Breath Severity:  Moderate Onset quality:  Sudden Timing:  Constant Progression:  Worsening Chronicity:  Recurrent Context: activity   Relieved by:  Nothing Worsened by:  Nothing Ineffective treatments:  None tried Associated symptoms: no abdominal pain, no chest pain, no cough, no headaches and no rash     Past Medical History:  Diagnosis Date  . Cardiomyopathy ([place])   . COPD (chronic obstructive pulmonary disease) ([place])   . Hypertension   . Seizures ([place])   . Sleep apnea   . Traumatic brain injury [facility])    2013    Patient Active Problem List   Diagnosis Date Noted  . Alcohol dependence ([place]) 03/05/2013  . Depressive disorder, not elsewhere classified 03/05/2013  . Hypertension 02/22/2013  . Chest pain 02/22/2013  . Obstructive sleep apnea on CPAP 02/22/2013  . Alcohol abuse 02/22/2013  . Tobacco abuse 02/22/2013  . History of traumatic brain injury 02/22/2013    Past Surgical History:  Procedure Laterality Date  . APPENDECTOMY    . CARDIAC CATHETERIZATION  11/2008  . LEG SURGERY          Home Medications    Prior to Admission medications   Medication Sig Start Date End Date Taking? Authorizing Provider  calcium carbonate (TUMS - DOSED IN MG ELEMENTAL CALCIUM) 500 MG chewable tablet Chew 1 tablet (200 mg of elemental calcium total) by mouth daily as needed for heartburn. 02/23/13  Yes Cater, Wynelle Bourgeois, MD  ibuprofen (ADVIL) 200 MG tablet Take 200 mg by mouth every 6 (six) hours as needed for fever or moderate pain.   Yes  [provider]  Multiple Vitamin (MULTIVITAMIN WITH MINERALS) TABS tablet Take 1 tablet by mouth daily. 03/06/13  Yes Niel Hummer, NP  traZODone (DESYREL) 50 MG tablet Take 1 tablet (50 mg total) by mouth at bedtime as needed and may repeat dose one time if needed for sleep. 03/06/13  Yes Niel Hummer, NP  aspirin 81 MG EC tablet Take 1 tablet (81 mg total) by mouth daily. Patient not taking: Reported on 04/15/2019 03/06/13   Niel Hummer, NP  HYDROcodone-acetaminophen (NORCO/VICODIN) 5-325 MG tablet Take 1 tablet by mouth every 4 (four) hours as needed. Patient not taking: Reported on 04/15/2019 04/09/19   Isla Pence, MD  ibuprofen (ADVIL) 800 MG tablet Take 1 tablet (800 mg total) by mouth 3 (three) times daily. Patient not taking: Reported on 04/15/2019 04/09/19   Isla Pence, MD  metoprolol succinate (TOPROL-XL) 50 MG 24 hr tablet Take 1 tablet (50 mg total) by mouth daily. Take with or immediately following a meal. Patient not taking: Reported on 04/15/2019 03/06/13   Niel Hummer, NP  QUEtiapine (SEROQUEL) 100 MG tablet Take 1 tablet (100 mg total) by mouth at bedtime. Patient not taking: Reported on 04/15/2019 03/06/13   Niel Hummer, NP    Family History Family History  Problem Relation Age of Onset  . Heart failure Father   . Diabetes Father   . Diabetes Mother   .  Diabetes Brother   . Heart failure Paternal Grandfather     Social History Social History   Tobacco Use  . Smoking status: Current Every Day Smoker    Packs/day: 0.50    Types: Cigarettes  . Smokeless tobacco: Never Used  Substance Use Topics  . Alcohol use: Yes    Alcohol/week: 14.0 standard drinks    Types: 14 Cans of beer per week    Comment: will sometimes drink until he "losses count"  5 times weekly  . Drug use: Yes    Types: Marijuana     Allergies   Lisinopril   Review of Systems Review of Systems  Constitutional: Negative for appetite change and fatigue.  HENT:  Negative for congestion, ear discharge and sinus pressure.   Eyes: Negative for discharge.  Respiratory: Positive for shortness of breath. Negative for cough.   Cardiovascular: Negative for chest pain.  Gastrointestinal: Negative for abdominal pain and diarrhea.  Genitourinary: Negative for frequency and hematuria.  Musculoskeletal: Negative for back pain.  Skin: Negative for rash.  Neurological: Negative for seizures and headaches.  Psychiatric/Behavioral: Negative for hallucinations.     Physical Exam Updated Vital Signs BP (!) 144/81   Pulse (!) 46   Temp 98.6 F (37 C) (Oral)   Resp 17   Ht 5\' 10"  (1.778 m)   Wt 102.1 kg   SpO2 90%   BMI 32.28 kg/m   Physical Exam Vitals signs and nursing note reviewed.  Constitutional:      Appearance: He is well-developed.  HENT:     Head: Normocephalic.     Nose: Nose normal.  Eyes:     General: No scleral icterus.    Conjunctiva/sclera: Conjunctivae normal.  Neck:     Musculoskeletal: Neck supple.     Thyroid: No thyromegaly.  Cardiovascular:     Rate and Rhythm: Normal rate and regular rhythm.     Heart sounds: No murmur. No friction rub. No gallop.   Pulmonary:     Breath sounds: No stridor. Wheezing present. No rales.  Chest:     Chest wall: No tenderness.  Abdominal:     General: There is no distension.     Tenderness: There is no abdominal tenderness. There is no rebound.  Musculoskeletal: Normal range of motion.  Lymphadenopathy:     Cervical: No cervical adenopathy.  Skin:    Findings: No erythema or rash.  Neurological:     Mental Status: He is oriented to person, place, and time.     Motor: No abnormal muscle tone.     Coordination: Coordination normal.  Psychiatric:        Behavior: Behavior normal.      ED Treatments / Results  Labs (all labs ordered are listed, but only abnormal results are displayed) Labs Reviewed  CBC WITH DIFFERENTIAL/PLATELET - Abnormal; Notable for the following components:       Result Value   RBC 4.16 (*)    All other components within normal limits  COMPREHENSIVE METABOLIC PANEL - Abnormal; Notable for the following components:   Glucose, Bld 113 (*)    Albumin 3.4 (*)    All other components within normal limits  SARS CORONAVIRUS 2 (TAT 6-24 HRS)  URINALYSIS, ROUTINE W REFLEX MICROSCOPIC    EKG None  Radiology Dg Chest Port 1 View  Result Date: 04/15/2019 CLINICAL DATA:  Shortness of breath EXAM: PORTABLE CHEST 1 VIEW COMPARISON:  04/09/2019 FINDINGS: Heart and mediastinal contours are within normal limits. No focal  opacities or effusions. No acute bony abnormality. IMPRESSION: No active disease. Electronically Signed   By: Charlett Nose M.D.   On: 04/15/2019 19:37    Procedures Procedures (including critical care time)  Medications Ordered in ED Medications  acetaminophen (TYLENOL) tablet 650 mg (650 mg Oral Given 04/15/19 1932)  oxyCODONE-acetaminophen (PERCOCET/ROXICET) 5-325 MG per tablet 1 tablet (1 tablet Oral Given 04/15/19 1932)  sodium chloride 0.9 % bolus 1,000 mL (1,000 mLs Intravenous New Bag/Given 04/15/19 2018)  albuterol (VENTOLIN HFA) 108 (90 Base) MCG/ACT inhaler 6 puff (6 puffs Inhalation Given 04/15/19 1940)  methylPREDNISolone sodium succinate (SOLU-MEDROL) 125 mg/2 mL injection 125 mg (125 mg Intravenous Given 04/15/19 2018)     Initial Impression / Assessment and Plan / ED Course  I have reviewed the triage vital signs and the nursing notes.  Pertinent labs & imaging results that were available during my care of the patient were reviewed by me and considered in my medical decision making (see chart for details).        Labs unremarkable.  Chest x-ray does not show infection.  Patient is hypoxic just laying in the bed with O2 sat of 90%.  Covid test pending.  Suspect viral infection with exacerbation of his COPD.  Possible COVID-19 infection  Final Clinical Impressions(s) / ED Diagnoses   Final diagnoses:  None    ED  Discharge Orders    None       Bethann Berkshire, MD 04/16/19 [phone]

## 2019-04-16 NOTE — Plan of Care (Signed)

## 2019-04-16 NOTE — Progress Notes (Signed)
Hospitalist FU note  Patient admitted early this AM with COPD exacerbation and suspected COVID infection. - await COVID test and respiratory viral panel - body pain likely from viral syndrome

## 2019-04-17 MED ORDER — SODIUM CHLORIDE 0.9 % IV BOLUS
500.0000 mL | Freq: Once | INTRAVENOUS | Status: AC
  Administered 2019-04-17: 06:00:00 500 mL via INTRAVENOUS

## 2019-04-17 MED ORDER — TRAMADOL HCL 50 MG PO TABS: 50.0000 mg | ORAL_TABLET | Freq: Four times a day (QID) | ORAL | 0 refills | Status: AC | PRN

## 2019-04-17 MED ORDER — SODIUM CHLORIDE 0.9 % IV SOLN
1.5000 g | Freq: Four times a day (QID) | INTRAVENOUS | Status: DC
  Administered 2019-04-17 – 2019-04-18 (×5): 1.5 g via INTRAVENOUS
  Filled 2019-04-17 (×2): qty 1.5
  Filled 2019-04-17: qty 4
  Filled 2019-04-17 (×3): qty 1.5

## 2019-04-17 MED ORDER — ACETAMINOPHEN 325 MG PO TABS
325.0000 mg | ORAL_TABLET | Freq: Once | ORAL | Status: AC
  Administered 2019-04-17: 06:00:00 325 mg via ORAL
  Filled 2019-04-17: qty 1

## 2019-04-17 MED ORDER — PENICILLIN V POTASSIUM 500 MG PO TABS: 500.0000 mg | ORAL_TABLET | Freq: Three times a day (TID) | ORAL | 0 refills | Status: AC

## 2019-04-17 MED ORDER — PENICILLIN V POTASSIUM 250 MG PO TABS
500.0000 mg | ORAL_TABLET | Freq: Three times a day (TID) | ORAL | Status: DC
  Administered 2019-04-17: 12:00:00 500 mg via ORAL
  Filled 2019-04-17 (×3): qty 2

## 2019-04-17 MED FILL — ?PENICILLIN VK 500 MG TABLE: 500 | 10 days supply | Qty: 30 | Fill #0

## 2019-04-17 NOTE — Plan of Care (Signed)

## 2019-04-17 NOTE — TOC Initial Note (Signed)
Transition of Care [facility]) - Initial/Assessment Note    Patient Details  Name: Robert Buck MRN: [id number] Date of Birth: December 25, 1960  [facility] CM/SW Contact:    Dessa Phi, RN Phone Number: 04/17/2019, 10:28 AM  Clinical Narrative: Provided patient w/area homeless shelter list. Patient a+ox3, ambulating independently-can call on own for acceptance & process, provided w/[facility] pcp list for patient care center-patient will call on own for appt, also [facility] pharmacy for meds-patient to go directly there @ d/c, local buses running for free. Patient voiced understanding of all info. No further CM needs.             Expected Discharge Plan: Homeless Shelter Barriers to Discharge: No Barriers Identified   Patient Goals and CMS Choice        Expected Discharge Plan and Services Expected Discharge Plan: Homeless Shelter   Discharge Planning Services: CM Consult, Medication Assistance, [facility]     Expected Discharge Date: 04/17/19                                    Prior Living Arrangements/Services   Lives with:: Other (Comment)(Homeless) Patient language and need for interpreter reviewed:: Yes Do you feel safe going back to the place where you live?: No   homeless. Provided w/homeless shelter list  Need for Family Participation in Patient Care: No (Comment) Care giver support system in place?: No (comment)   Criminal Activity/Legal Involvement Pertinent to Current Situation/Hospitalization: Yes - Comment as needed(Per nursing has ankle bracelet -to check in with probation officer.)  Activities of Daily Living Home Assistive Devices/Equipment: CPAP ADL Screening (condition at time of admission) Patient's cognitive ability adequate to safely complete daily activities?: Yes Is the patient deaf or have difficulty hearing?: No Does the patient have difficulty seeing, even when wearing glasses/contacts?: No Does the patient have difficulty  concentrating, remembering, or making decisions?: No Patient able to express need for assistance with ADLs?: Yes Does the patient have difficulty dressing or bathing?: No Independently performs ADLs?: Yes (appropriate for developmental age)(patient very weak) Does the patient have difficulty walking or climbing stairs?: Yes(secondary to weakness) Weakness of Legs: Both Weakness of Arms/Hands: None  Permission Sought/Granted Permission sought to share information with : Case Manager Permission granted to share information with : Yes, Verbal Permission Granted  Share Information with NAMEJuliann Pulse [facility] [phone]           Emotional Assessment Appearance:: Appears stated age Attitude/Demeanor/Rapport: Gracious Affect (typically observed): Accepting Orientation: : Oriented to Self, Oriented to Place, Oriented to  Time, Oriented to Situation Alcohol / Substance Use: Tobacco Use, Alcohol Use Psych Involvement: No (comment)  Admission diagnosis:  Hypoxia [R09.02] COPD exacerbation (H[facility]) [J44.1] Fever, unspecified fever cause [R50.9] Acute respiratory failure with hypoxia ([place]) [J96.01] Patient Active Problem List   Diagnosis Date Noted  . Acute respiratory failure with hypoxia ([place]) 04/16/2019  . Fever   . Alcohol dependence ([place]) 03/05/2013  . Depressive disorder, not elsewhere classified 03/05/2013  . Hypertension 02/22/2013  . Chest pain 02/22/2013  . Obstructive sleep apnea on CPAP 02/22/2013  . Alcohol abuse 02/22/2013  . Tobacco abuse 02/22/2013  . History of traumatic brain injury 02/22/2013   PCP:  Patient, No Pcp Per Pharmacy:   [place], [place] - [address] [place] [address] Phone: [phone] Fax: [phone]  Social Determinants of Health (SDOH) Interventions    Readmission Risk Interventions No flowsheet data found.

## 2019-04-17 NOTE — Discharge Summary (Addendum)
Discharge Summary  Robert Buck ZOX:[id number] DOB: Oct 13, 1960  PCP: Patient, No Pcp Per  Admit date: 04/15/2019 Discharge date: 04/17/2019   Recommendations for Outpatient Follow-up:  1. PCP in 2 weeks  Discharge Diagnoses:  Active Hospital Problems   Diagnosis Date Noted  . Acute respiratory failure with hypoxia (HCC) due to Pharyngitis 04/16/2019  Pharyngitis-suspected group A strep  Resolved Hospital Problems  No resolved problems to display.    Discharge Condition: Stable   Diet recommendation: Regular diet  Vitals:   04/17/19 0700 04/17/19 0740  BP:    Pulse:    Resp:    Temp: 100 F (37.8 C)   SpO2:  95%    HPI and Brief Hospital Course:  This is a pleasant 58 year old male with a history of hypertension and COPD not on any medications who presented to the ER for the second time in the last 5 days on the day of admission, with complaints of headache, generalized body ache shortness of breath and nonproductive cough.  He had negative Covid swab on October 26 as well as again this admission on 11/3.  Due to complaints of neck pain and headache, he had CT scan of the head and neck, was found to have evidence of pharyngitis on CT scan.  He remained febrile in the hospital, he was started on oral antibiotic here in the hospital today.  He is stable, he is tolerating oral diet, he has no shortness of breath, stridor, has no significant oropharyngeal swelling no evidence of abscess.  He is managing his saliva.  There is no evidence of worsening infection or need for surgery.  Since the patient is stable and reliable, will discharge from the hospital today to take oral penicillin for the next 10 days.  Precautionary measures were discussed with the patient at length, he also says that he is somewhat homeless as he was recently kicked out of his home by his sister.  Will have social work discuss the patient's discharge with him.  Discharge details, plan of care and follow up  instructions were discussed with patient and any available family or care providers. Patient and family are in agreement with discharge from the hospital today and all questions were answered to their satisfaction.  Discharge Exam: BP (!) 152/88 (BP Location: Right Arm)   Pulse (!) 102   Temp 100 F (37.8 C) (Oral)   Resp 19   Ht 5\' 10"  (1.778 m)   Wt 102.1 kg   SpO2 95%   BMI 32.28 kg/m  General:  Alert, oriented, calm, in no acute distress though he does appear tired Eyes: EOMI, clear sclerea Neck: supple, no masses, trachea mildline, oropharynx erythematous, but no purulent discharge, minimal lymphadenopathy, no stridor or distress Cardiovascular: RRR, no murmurs or rubs, no peripheral edema  Respiratory: clear to auscultation bilaterally, no wheezes, no crackles  Abdomen: soft, nontender, nondistended, normal bowel tones heard  Skin: dry, no rashes  Musculoskeletal: no joint effusions, normal range of motion  Psychiatric: appropriate affect, normal speech  Neurologic: extraocular muscles intact, clear speech, moving all extremities with intact sensorium    Discharge Instructions You were cared for by a hospitalist during your hospital stay. If you have any questions about your discharge medications or the care you received while you were in the hospital after you are discharged, you can call the unit and asked to speak with the hospitalist on call if the hospitalist that took care of you is not available. Once you are  discharged, your primary care physician will handle any further medical issues. Please note that NO REFILLS for any discharge medications will be authorized once you are discharged, as it is imperative that you return to your primary care physician (or establish a relationship with a primary care physician if you do not have one) for your aftercare needs so that they can reassess your need for medications and monitor your lab values.   Allergies as of 04/17/2019       Reactions   Lisinopril Cough      Medication List    STOP taking these medications   HYDROcodone-acetaminophen 5-325 MG tablet Commonly known as: NORCO/VICODIN     TAKE these medications   aspirin 81 MG EC tablet Take 1 tablet (81 mg total) by mouth daily.   calcium carbonate 500 MG chewable tablet Commonly known as: TUMS - dosed in mg elemental calcium Chew 1 tablet (200 mg of elemental calcium total) by mouth daily as needed for heartburn.   ibuprofen 200 MG tablet Commonly known as: ADVIL Take 200 mg by mouth every 6 (six) hours as needed for fever or moderate pain.   ibuprofen 800 MG tablet Commonly known as: ADVIL Take 1 tablet (800 mg total) by mouth 3 (three) times daily.   metoprolol succinate 50 MG 24 hr tablet Commonly known as: TOPROL-XL Take 1 tablet (50 mg total) by mouth daily. Take with or immediately following a meal.   multivitamin with minerals Tabs tablet Take 1 tablet by mouth daily.   penicillin v potassium 500 MG tablet Commonly known as: VEETID Take 1 tablet (500 mg total) by mouth every 8 (eight) hours for 10 days.   QUEtiapine 100 MG tablet Commonly known as: SEROQUEL Take 1 tablet (100 mg total) by mouth at bedtime.   traMADol 50 MG tablet Commonly known as: ULTRAM Take 1 tablet (50 mg total) by mouth every 6 (six) hours as needed for up to 3 days for moderate pain (or Headache unrelieved by tylenol).   traZODone 50 MG tablet Commonly known as: DESYREL Take 1 tablet (50 mg total) by mouth at bedtime as needed and may repeat dose one time if needed for sleep.      Allergies  Allergen Reactions  . Lisinopril Cough      The results of significant diagnostics from this hospitalization (including imaging, microbiology, ancillary and laboratory) are listed below for reference.    Significant Diagnostic Studies: Ct Head Wo Contrast  Result Date: 04/16/2019 CLINICAL DATA:  Acute headache with normal neuro exam EXAM: CT HEAD WITHOUT  CONTRAST TECHNIQUE: Contiguous axial images were obtained from the base of the skull through the vertex without intravenous contrast. COMPARISON:  03/03/2013 FINDINGS: Brain: No evidence of acute infarction, hemorrhage, hydrocephalus, or mass lesion/mass effect. Small arachnoid cyst widening the left postcentral gyrus, stable. Mild cerebral volume loss. Vascular: No hyperdense vessel or unexpected calcification. Skull: Stable subcutaneous scarring in the right posterior scalp. No calvarial fracture or destructive process Sinuses/Orbits: Negative Other: Mild, intermittent motion degradation. IMPRESSION: No acute finding or change since 2014. Electronically Signed   By: Marnee Spring M.D.   On: 04/16/2019 06:35   Ct Soft Tissue Neck W Contrast  Result Date: 04/16/2019 CLINICAL DATA:  Neck pain. Fever and weakness. Shortness of breath. EXAM: CT NECK WITH CONTRAST TECHNIQUE: Multidetector CT imaging of the neck was performed using the standard protocol following the bolus administration of intravenous contrast. CONTRAST:  OMNIPAQUE IOHEXOL 350 MG/ML SOLN, OMNIPAQUE IOHEXOL 350  MG/ML SOLN COMPARISON:  Cervical spine CT 12/25/2012 FINDINGS: Pharynx and larynx: There is mild diffuse prominence of the adenoid tissue, palatine, and lingual tonsils. No mass lesion or developing abscess is present. There is mild stranding in the parapharyngeal fat, left greater than right. Airway is patent. Epiglottis is normal. Hypopharynx is within normal limits. Vocal cords are midline and symmetric. The trachea is normal. Salivary glands: Submandibular and parotid glands and ducts are within normal limits. Thyroid: Normal Lymph nodes: No significant cervical adenopathy is present. Vascular: Significant vascular lesions or stenosis are present. A 3 vessel arch configuration is present. Limited intracranial: Within normal limits. Visualized orbits: The globes and orbits are within normal limits. Mastoids and visualized  paranasal sinuses: The paranasal sinuses and mastoid air cells are clear. Skeleton: There is straightening and reversal of the normal cervical lordosis. Multilevel degenerative changes are similar the prior studies. No focal lytic or blastic lesions are present. Upper chest: The lung apices are clear. Please see dedicated CT of the chest from the same day. IMPRESSION: 1. Mild diffuse prominence of the adenoid tissue, palatine, and lingual tonsils without a discrete mass lesion or developing abscess. Findings are consistent with acute pharyngitis without complicating features. 2. Stable multilevel degenerative changes in the cervical spine. Electronically Signed   By: San Morelle M.D.   On: 04/16/2019 10:14   Ct Angio Chest Pe W Or Wo Contrast  Result Date: 04/16/2019 CLINICAL DATA:  Shortness of breath EXAM: CT ANGIOGRAPHY CHEST WITH CONTRAST TECHNIQUE: Multidetector CT imaging of the chest was performed using the standard protocol during bolus administration of intravenous contrast. Multiplanar CT image reconstructions and MIPs were obtained to evaluate the vascular anatomy. CONTRAST:  178mL OMNIPAQUE IOHEXOL 350 MG/ML SOLN, 115mL OMNIPAQUE IOHEXOL 350 MG/ML SOLN COMPARISON:  None. FINDINGS: Cardiovascular: There is no central or lobar pulmonary embolism. There is poor evaluation segmental and subsegmental branches due to artifact. Mediastinum/Nodes: No adenopathy.  Thyroid is unremarkable. Lungs/Pleura: Subsegmental atelectasis. No pleural effusion or pneumothorax. Upper Abdomen: No acute abnormality. Musculoskeletal: Degenerative changes of the included spine. There is contrast material in the soft tissues of the right upper extremity related to IV infiltration during contrast injection. Review of the MIP images confirms the above findings. IMPRESSION: No acute central or lobar pulmonary embolism. Suboptimal evaluation more distally. Subsegmental atelectasis.  Otherwise clear lungs. Electronically  Signed   By: Macy Mis M.D.   On: 04/16/2019 11:00   Dg Chest Port 1 View  Result Date: 04/15/2019 CLINICAL DATA:  Shortness of breath EXAM: PORTABLE CHEST 1 VIEW COMPARISON:  04/09/2019 FINDINGS: Heart and mediastinal contours are within normal limits. No focal opacities or effusions. No acute bony abnormality. IMPRESSION: No active disease. Electronically Signed   By: Rolm Baptise M.D.   On: 04/15/2019 19:37   Dg Chest Portable 1 View  Result Date: 04/09/2019 CLINICAL DATA:  Fever. EXAM: PORTABLE CHEST 1 VIEW COMPARISON:  February 25, 2013. FINDINGS: The heart size and mediastinal contours are within normal limits. No pneumothorax or pleural effusion is noted. Left lung is clear. Minimal right midlung subsegmental atelectasis is noted. The visualized skeletal structures are unremarkable. IMPRESSION: Minimal right midlung subsegmental atelectasis. Electronically Signed   By: Marijo Conception M.D.   On: 04/09/2019 09:22    Microbiology: Recent Results (from the past 240 hour(s))  SARS CORONAVIRUS 2 (TAT 6-24 HRS) Nasopharyngeal Nasopharyngeal Swab     Status: None   Collection Time: 04/09/19  8:40 AM   Specimen: Nasopharyngeal Swab  Result  Value Ref Range Status   SARS Coronavirus 2 NEGATIVE NEGATIVE Final    Comment: (NOTE) SARS-CoV-2 target nucleic acids are NOT DETECTED. The SARS-CoV-2 RNA is generally detectable in upper and lower respiratory specimens during the acute phase of infection. Negative results do not preclude SARS-CoV-2 infection, do not rule out co-infections with other pathogens, and should not be used as the sole basis for treatment or other patient management decisions. Negative results must be combined with clinical observations, patient history, and epidemiological information. The expected result is Negative. Fact Sheet for Patients: HairSlick.no Fact Sheet for Healthcare Providers: quierodirigir.com  This test is not yet approved or cleared by the [place] FDA and  has been authorized for detection and/or diagnosis of SARS-CoV-2 by FDA under an Emergency Use Authorization (EUA). This EUA will remain  in effect (meaning this test can be used) for the duration of the COVID-19 declaration under Section 56 4(b)(1) of the Act, 21 U.S.C. section 360bbb-3(b)(1), unless the authorization is terminated or revoked sooner. Performed at [facility], [address] [place], [place] [address]   SARS CORONAVIRUS 2 (TAT 6-24 HRS) Nasopharyngeal Nasopharyngeal Swab     Status: None   Collection Time: 04/15/19  7:09 PM   Specimen: Nasopharyngeal Swab  Result Value Ref Range Status   SARS Coronavirus 2 NEGATIVE NEGATIVE Final    Comment: (NOTE) SARS-CoV-2 target nucleic acids are NOT DETECTED. The SARS-CoV-2 RNA is generally detectable in upper and lower respiratory specimens during the acute phase of infection. Negative results do not preclude SARS-CoV-2 infection, do not rule out co-infections with other pathogens, and should not be used as the sole basis for treatment or other patient management decisions. Negative results must be combined with clinical observations, patient history, and epidemiological information. The expected result is Negative. Fact Sheet for Patients: HairSlick.no Fact Sheet for Healthcare Providers: quierodirigir.com This test is not yet approved or cleared by the [place] FDA and  has been authorized for detection and/or diagnosis of SARS-CoV-2 by FDA under an Emergency Use Authorization (EUA). This EUA will remain  in effect (meaning this test can be used) for the duration of the COVID-19 declaration under Section 56 4(b)(1) of the Act, 21 U.S.C. section 360bbb-3(b)(1), unless the authorization is terminated or revoked sooner. Performed at [facility], [address], [place], [place]  [address]   Respiratory Panel by PCR     Status: None   Collection Time: 04/16/19  6:44 AM   Specimen: Nasopharyngeal Swab; Respiratory  Result Value Ref Range Status   Adenovirus NOT DETECTED NOT DETECTED Final   Coronavirus 229E NOT DETECTED NOT DETECTED Final    Comment: (NOTE) The Coronavirus on the Respiratory Panel, DOES NOT test for the novel  Coronavirus (2019 nCoV)    Coronavirus HKU1 NOT DETECTED NOT DETECTED Final   Coronavirus NL63 NOT DETECTED NOT DETECTED Final   Coronavirus OC43 NOT DETECTED NOT DETECTED Final   Metapneumovirus NOT DETECTED NOT DETECTED Final   Rhinovirus / Enterovirus NOT DETECTED NOT DETECTED Final   Influenza A NOT DETECTED NOT DETECTED Final   Influenza B NOT DETECTED NOT DETECTED Final   Parainfluenza Virus 1 NOT DETECTED NOT DETECTED Final   Parainfluenza Virus 2 NOT DETECTED NOT DETECTED Final   Parainfluenza Virus 3 NOT DETECTED NOT DETECTED Final   Parainfluenza Virus 4 NOT DETECTED NOT DETECTED Final   Respiratory Syncytial Virus NOT DETECTED NOT DETECTED Final   Bordetella pertussis NOT DETECTED NOT DETECTED Final  Chlamydophila pneumoniae NOT DETECTED NOT DETECTED Final   Mycoplasma pneumoniae NOT DETECTED NOT DETECTED Final    Comment: Performed at [facility], [address] [place], [place] [address]  SARS CORONAVIRUS 2 (TAT 6-24 HRS) Nasopharyngeal Nasopharyngeal Swab     Status: None   Collection Time: 04/16/19  6:44 AM   Specimen: Nasopharyngeal Swab  Result Value Ref Range Status   SARS Coronavirus 2 NEGATIVE NEGATIVE Final    Comment: (NOTE) SARS-CoV-2 target nucleic acids are NOT DETECTED. The SARS-CoV-2 RNA is generally detectable in upper and lower respiratory specimens during the acute phase of infection. Negative results do not preclude SARS-CoV-2 infection, do not rule out co-infections with other pathogens, and should not be used as the sole basis for treatment or other patient management decisions. Negative  results must be combined with clinical observations, patient history, and epidemiological information. The expected result is Negative. Fact Sheet for Patients: HairSlick.no Fact Sheet for Healthcare Providers: quierodirigir.com This test is not yet approved or cleared by the [place] FDA and  has been authorized for detection and/or diagnosis of SARS-CoV-2 by FDA under an Emergency Use Authorization (EUA). This EUA will remain  in effect (meaning this test can be used) for the duration of the COVID-19 declaration under Section 56 4(b)(1) of the Act, 21 U.S.C. section 360bbb-3(b)(1), unless the authorization is terminated or revoked sooner. Performed at [facility], [address] [place], [place] [address]      Labs: Basic Metabolic Panel: Recent Labs  Lab 04/15/19 2015 04/16/19 0855  NA 135 136  K 3.6 3.9  CL 101 104  CO2 24 24  GLUCOSE 113* 164*  BUN 13 14  CREATININE 0.86 0.77  CALCIUM 8.9 8.9   Liver Function Tests: Recent Labs  Lab 04/15/19 2015 04/16/19 0855  AST 29 21  ALT 20 17  ALKPHOS 83 85  BILITOT 0.7 0.8  PROT 6.8 7.0  ALBUMIN 3.4* 3.4*   No results for input(s): LIPASE, AMYLASE in the last 168 hours. No results for input(s): AMMONIA in the last 168 hours. CBC: Recent Labs  Lab 04/15/19 2015 04/16/19 0855  WBC 7.7 8.2  NEUTROABS 5.6 7.2  HGB 13.7 14.6  HCT 40.6 44.3  MCV 97.6 98.4  PLT 197 238   Cardiac Enzymes: No results for input(s): CKTOTAL, CKMB, CKMBINDEX, TROPONINI in the last 168 hours. BNP: BNP (last 3 results) No results for input(s): BNP in the last 8760 hours.  ProBNP (last 3 results) No results for input(s): PROBNP in the last 8760 hours.  CBG: No results for input(s): GLUCAP in the last 168 hours.  Time spent: 31 minutes were spent in preparing this discharge including medication reconciliation, counseling, and coordination of care.  Note that the  patient was admitted to the hospital under inpatient status due to his high fevers, and concern for acute viral or bacterial infection.  He has improved more rapidly than expected and is ready for discharge home from the hospital today.  Signed:  Glema Takaki Vergie Living, MD  [facility] 04/17/2019, 8:43 AM

## 2019-04-18 DIAGNOSIS — J9601 Acute respiratory failure with hypoxia: Secondary | ICD-10-CM

## 2019-04-18 MED ORDER — MAGIC MOUTHWASH W/LIDOCAINE: 5.0000 mL | Freq: Three times a day (TID) | ORAL | 0 refills | Status: AC | PRN

## 2019-04-18 MED ORDER — KETOROLAC TROMETHAMINE 15 MG/ML IJ SOLN
15.0000 mg | Freq: Once | INTRAMUSCULAR | Status: AC
  Administered 2019-04-18: 14:00:00 15 mg via INTRAVENOUS
  Filled 2019-04-18: qty 1

## 2019-04-18 MED ORDER — BUTALBITAL-APAP-CAFFEINE 50-325-40 MG PO TABS
1.0000 | ORAL_TABLET | Freq: Four times a day (QID) | ORAL | Status: DC | PRN
  Administered 2019-04-18: 1 via ORAL
  Filled 2019-04-18: qty 1

## 2019-04-18 NOTE — Progress Notes (Signed)
Please see discharge summary from yesterday.  Patient afebrile overnight and this morning.  Still has some throat discomfort.  Will prescribe Magic mouthwash.  He complained of headache and Fioricet x1 dose given which he tolerated well.  He is otherwise clear for discharge as planned yesterday.  Per case management resources to catch a bus were provided to patient.

## 2019-04-18 NOTE — Progress Notes (Signed)
Patient reports headache is 4/10 after Toradol.  Patient states he, "Feels better and is ready to leave."  Dr. Earnest Conroy notified via text page.

## 2019-04-18 NOTE — Progress Notes (Signed)
Patient's IV removed.  Site WNL.  AVS reviewed with patient.  Verbalized understanding, discharge instructions, physician follow-up.  Denies dizziness.  Reports pain improved.  MD spoke with patient on phone prior to discharge.  Patient transported by NT via wheelchair to entrance at discharge. Patient stable at time of discharge.

## 2019-05-21 ENCOUNTER — Encounter (HOSPITAL_COMMUNITY): Payer: Self-pay

## 2019-05-21 ENCOUNTER — Other Ambulatory Visit: Payer: Self-pay

## 2019-05-21 ENCOUNTER — Emergency Department (HOSPITAL_COMMUNITY)
Admission: EM | Admit: 2019-05-21 | Discharge: 2019-05-22 | Disposition: A | Discharge status: Home / Self Care | Payer: No Typology Code available for payment source | Attending: Emergency Medicine | Admitting: Emergency Medicine

## 2019-05-21 DIAGNOSIS — S161XXA Strain of muscle, fascia and tendon at neck level, initial encounter: Secondary | ICD-10-CM | POA: Insufficient documentation

## 2019-05-21 DIAGNOSIS — Y999 Unspecified external cause status: Secondary | ICD-10-CM | POA: Diagnosis not present

## 2019-05-21 DIAGNOSIS — S7001XA Contusion of right hip, initial encounter: Secondary | ICD-10-CM | POA: Diagnosis not present

## 2019-05-21 DIAGNOSIS — J449 Chronic obstructive pulmonary disease, unspecified: Secondary | ICD-10-CM | POA: Insufficient documentation

## 2019-05-21 DIAGNOSIS — M545 Low back pain: Secondary | ICD-10-CM | POA: Insufficient documentation

## 2019-05-21 DIAGNOSIS — Z7141 Alcohol abuse counseling and surveillance of alcoholic: Secondary | ICD-10-CM | POA: Insufficient documentation

## 2019-05-21 DIAGNOSIS — S79911A Unspecified injury of right hip, initial encounter: Secondary | ICD-10-CM | POA: Diagnosis present

## 2019-05-21 DIAGNOSIS — Z79899 Other long term (current) drug therapy: Secondary | ICD-10-CM | POA: Insufficient documentation

## 2019-05-21 DIAGNOSIS — Y9289 Other specified places as the place of occurrence of the external cause: Secondary | ICD-10-CM | POA: Diagnosis not present

## 2019-05-21 DIAGNOSIS — Y9389 Activity, other specified: Secondary | ICD-10-CM | POA: Insufficient documentation

## 2019-05-21 DIAGNOSIS — F17210 Nicotine dependence, cigarettes, uncomplicated: Secondary | ICD-10-CM | POA: Insufficient documentation

## 2019-05-21 DIAGNOSIS — I10 Essential (primary) hypertension: Secondary | ICD-10-CM | POA: Insufficient documentation

## 2019-05-21 NOTE — ED Triage Notes (Signed)
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 Denies any SI or HI.
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
## 2019-05-22 ENCOUNTER — Emergency Department (HOSPITAL_COMMUNITY): Payer: No Typology Code available for payment source

## 2019-05-22 NOTE — ED Notes (Signed)
Patient verbalized multiple times that he was being denied detox. Patient was educated on discharge paperwork about outpatient resources the MD has provided.

## 2019-05-22 NOTE — ED Notes (Addendum)
Patient was verbalized discharge instructions. Pt had no further questions at this time. NAD. Unable to obtain signature at this time.

## 2019-05-22 NOTE — Discharge Instructions (Signed)
Ibuprofen 600 mg every 6 hours as needed for pain.  The resources guide for outpatient substance abuse treatment centers has been provided in this discharge summary for you to call and make these arrangements.

## 2019-08-02 ENCOUNTER — Other Ambulatory Visit: Payer: Self-pay

## 2019-08-02 ENCOUNTER — Emergency Department (HOSPITAL_COMMUNITY)
Admission: EM | Admit: 2019-08-02 | Discharge: 2019-08-03 | Disposition: A | Discharge status: Home / Self Care | Payer: Self-pay | Attending: Emergency Medicine | Admitting: Emergency Medicine

## 2019-08-02 ENCOUNTER — Encounter (HOSPITAL_COMMUNITY): Payer: Self-pay | Admitting: *Deleted

## 2019-08-02 DIAGNOSIS — S161XXA Strain of muscle, fascia and tendon at neck level, initial encounter: Secondary | ICD-10-CM | POA: Insufficient documentation

## 2019-08-02 DIAGNOSIS — S3094XA Unspecified superficial injury of scrotum and testes, initial encounter: Secondary | ICD-10-CM | POA: Insufficient documentation

## 2019-08-02 DIAGNOSIS — J449 Chronic obstructive pulmonary disease, unspecified: Secondary | ICD-10-CM | POA: Insufficient documentation

## 2019-08-02 DIAGNOSIS — Y929 Unspecified place or not applicable: Secondary | ICD-10-CM | POA: Insufficient documentation

## 2019-08-02 DIAGNOSIS — S0990XA Unspecified injury of head, initial encounter: Secondary | ICD-10-CM

## 2019-08-02 DIAGNOSIS — F17210 Nicotine dependence, cigarettes, uncomplicated: Secondary | ICD-10-CM | POA: Insufficient documentation

## 2019-08-02 DIAGNOSIS — R52 Pain, unspecified: Secondary | ICD-10-CM

## 2019-08-02 DIAGNOSIS — S29012A Strain of muscle and tendon of back wall of thorax, initial encounter: Secondary | ICD-10-CM | POA: Insufficient documentation

## 2019-08-02 DIAGNOSIS — S3991XA Unspecified injury of abdomen, initial encounter: Secondary | ICD-10-CM

## 2019-08-02 DIAGNOSIS — N5082 Scrotal pain: Secondary | ICD-10-CM

## 2019-08-02 DIAGNOSIS — S060X1A Concussion with loss of consciousness of 30 minutes or less, initial encounter: Secondary | ICD-10-CM | POA: Insufficient documentation

## 2019-08-02 DIAGNOSIS — S39012A Strain of muscle, fascia and tendon of lower back, initial encounter: Secondary | ICD-10-CM

## 2019-08-02 DIAGNOSIS — Y939 Activity, unspecified: Secondary | ICD-10-CM | POA: Insufficient documentation

## 2019-08-02 DIAGNOSIS — I10 Essential (primary) hypertension: Secondary | ICD-10-CM | POA: Insufficient documentation

## 2019-08-02 DIAGNOSIS — Y999 Unspecified external cause status: Secondary | ICD-10-CM | POA: Insufficient documentation
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
## 2019-08-03 ENCOUNTER — Other Ambulatory Visit (HOSPITAL_COMMUNITY): Payer: Self-pay

## 2019-08-03 ENCOUNTER — Emergency Department (HOSPITAL_COMMUNITY): Payer: Self-pay

## 2019-08-03 LAB — CBC
HCT: 47.7 % (ref 39.0–52.0)
Hemoglobin: 15.7 g/dL (ref 13.0–17.0)
MCH: 32.4 pg (ref 26.0–34.0)
MCHC: 32.9 g/dL (ref 30.0–36.0)
MCV: 98.6 fL (ref 80.0–100.0)
Platelets: 184 10*3/uL (ref 150–400)
RBC: 4.84 MIL/uL (ref 4.22–5.81)
RDW: 13.5 % (ref 11.5–15.5)
WBC: 6.3 10*3/uL (ref 4.0–10.5)
nRBC: 0 % (ref 0.0–0.2)

## 2019-08-03 LAB — ETHANOL: Alcohol, Ethyl (B): 43 mg/dL — ABNORMAL HIGH (ref <10)

## 2019-08-03 LAB — BASIC METABOLIC PANEL
Anion gap: 12 (ref 5–15)
BUN: 11 mg/dL (ref 6–20)
CO2: 21 mmol/L — ABNORMAL LOW (ref 22–32)
Calcium: 8.9 mg/dL (ref 8.9–10.3)
Chloride: 107 mmol/L (ref 98–111)
Creatinine, Ser: 1 mg/dL (ref 0.61–1.24)
GFR calc Af Amer: >60 mL/min (ref >60)
GFR calc non Af Amer: >60 mL/min (ref >60)
Glucose, Bld: 92 mg/dL (ref 70–99)
Potassium: 4 mmol/L (ref 3.5–5.1)
Sodium: 140 mmol/L (ref 135–145)

## 2019-08-03 MED ORDER — FENTANYL CITRATE (PF) 100 MCG/2ML IJ SOLN
100.0000 ug | Freq: Once | INTRAMUSCULAR | Status: AC
  Administered 2019-08-03: 100 ug via INTRAVENOUS
  Filled 2019-08-03: qty 2

## 2019-08-03 MED ORDER — HYDROCODONE-ACETAMINOPHEN 5-325 MG PO TABS: 1.0000 | ORAL_TABLET | Freq: Four times a day (QID) | ORAL | 0 refills | Status: AC | PRN

## 2019-08-03 NOTE — ED Notes (Signed)
Patient verbalizes understanding of discharge instructions. Opportunity for questioning and answers were provided. Armband removed by staff, pt discharged from ED. Pt. ambulatory and discharged home.  
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
## 2019-08-03 NOTE — Discharge Instructions (Addendum)
You have neck pain, possibly from a cervical strain and/or pinched nerve.  ° °SEEK IMMEDIATE MEDICAL ATTENTION IF: °You develop difficulties swallowing or breathing.  °You have new or worse numbness, weakness, tingling, or movement problems in your arms or legs.  °You develop increasing pain which is uncontrolled with medications.  °You have change in bowel or bladder function, or other concerns. ° ° ° °

## 2019-08-03 NOTE — ED Notes (Signed)
Pt ambulated in hallway with ED Tech. No dizziness, lightheadedness, or weakness reported. Pt only expressed minor pain but was able to walk.

## 2019-08-04 ENCOUNTER — Telehealth: Payer: Self-pay | Admitting: Surgery

## 2020-05-13 DIAGNOSIS — R079 Chest pain, unspecified: Secondary | ICD-10-CM

## 2020-05-13 DIAGNOSIS — R55 Syncope and collapse: Secondary | ICD-10-CM

## 2020-05-15 DIAGNOSIS — R55 Syncope and collapse: Secondary | ICD-10-CM

## 2024-04-18 ENCOUNTER — Emergency Department (HOSPITAL_COMMUNITY): Payer: Self-pay

## 2024-04-18 ENCOUNTER — Emergency Department (HOSPITAL_COMMUNITY): Admission: EM | Admit: 2024-04-18 | Discharge: 2024-04-18 | Discharge status: Against Medical Advice | Payer: Self-pay

## 2024-04-18 ENCOUNTER — Other Ambulatory Visit: Payer: Self-pay

## 2024-04-18 ENCOUNTER — Encounter (HOSPITAL_COMMUNITY): Payer: Self-pay

## 2024-04-18 DIAGNOSIS — R350 Frequency of micturition: Secondary | ICD-10-CM | POA: Insufficient documentation

## 2024-04-18 DIAGNOSIS — Z5321 Procedure and treatment not carried out due to patient leaving prior to being seen by health care provider: Secondary | ICD-10-CM | POA: Insufficient documentation

## 2024-04-18 DIAGNOSIS — R300 Dysuria: Secondary | ICD-10-CM | POA: Insufficient documentation

## 2024-04-18 DIAGNOSIS — R079 Chest pain, unspecified: Secondary | ICD-10-CM | POA: Insufficient documentation

## 2024-04-18 LAB — TROPONIN I (HIGH SENSITIVITY): Troponin I (High Sensitivity): 6 ng/L (ref <18)

## 2024-04-18 LAB — BASIC METABOLIC PANEL WITH GFR
Anion gap: 12 (ref 5–15)
BUN: 14 mg/dL (ref 8–23)
CO2: 25 mmol/L (ref 22–32)
Calcium: 8.8 mg/dL — ABNORMAL LOW (ref 8.9–10.3)
Chloride: 101 mmol/L (ref 98–111)
Creatinine, Ser: 0.78 mg/dL (ref 0.61–1.24)
GFR, Estimated: >60 mL/min (ref >60)
Glucose, Bld: 84 mg/dL (ref 70–99)
Potassium: 3.6 mmol/L (ref 3.5–5.1)
Sodium: 138 mmol/L (ref 135–145)

## 2024-04-18 LAB — CBC
HCT: 43.8 % (ref 39.0–52.0)
Hemoglobin: 14.5 g/dL (ref 13.0–17.0)
MCH: 33.1 pg (ref 26.0–34.0)
MCHC: 33.1 g/dL (ref 30.0–36.0)
MCV: 100 fL (ref 80.0–100.0)
Platelets: 166 K/uL (ref 150–400)
RBC: 4.38 MIL/uL (ref 4.22–5.81)
RDW: 13.4 % (ref 11.5–15.5)
WBC: 8.3 K/uL (ref 4.0–10.5)
nRBC: 0 % (ref 0.0–0.2)
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
## 2024-04-18 NOTE — ED Triage Notes (Signed)
 Pt reports having trouble starting a stream when attempting to urinate and having urinary frequency. Pt also reporting chest pain. Pt states I don't know if it is PVCs or pnumonia but I am having pain in my chest area

## 2024-04-21 ENCOUNTER — Encounter (HOSPITAL_COMMUNITY): Payer: Self-pay

## 2024-04-21 ENCOUNTER — Other Ambulatory Visit: Payer: Self-pay

## 2024-04-21 ENCOUNTER — Emergency Department (HOSPITAL_COMMUNITY)
Admission: EM | Admit: 2024-04-21 | Discharge: 2024-04-21 | Disposition: A | Discharge status: Home / Self Care | Payer: Self-pay | Attending: Emergency Medicine | Admitting: Emergency Medicine

## 2024-04-21 ENCOUNTER — Emergency Department (HOSPITAL_COMMUNITY): Payer: Self-pay

## 2024-04-21 DIAGNOSIS — I10 Essential (primary) hypertension: Secondary | ICD-10-CM | POA: Insufficient documentation

## 2024-04-21 DIAGNOSIS — R059 Cough, unspecified: Secondary | ICD-10-CM | POA: Insufficient documentation

## 2024-04-21 DIAGNOSIS — R0789 Other chest pain: Secondary | ICD-10-CM | POA: Insufficient documentation

## 2024-04-21 DIAGNOSIS — K6289 Other specified diseases of anus and rectum: Secondary | ICD-10-CM | POA: Insufficient documentation

## 2024-04-21 DIAGNOSIS — J449 Chronic obstructive pulmonary disease, unspecified: Secondary | ICD-10-CM | POA: Insufficient documentation

## 2024-04-21 DIAGNOSIS — Z7982 Long term (current) use of aspirin: Secondary | ICD-10-CM | POA: Insufficient documentation

## 2024-04-21 LAB — URINALYSIS, ROUTINE W REFLEX MICROSCOPIC
Bilirubin Urine: NEGATIVE
Glucose, UA: NEGATIVE mg/dL
Hgb urine dipstick: NEGATIVE
Ketones, ur: NEGATIVE mg/dL
Leukocytes,Ua: NEGATIVE
Nitrite: NEGATIVE
Protein, ur: NEGATIVE mg/dL
Specific Gravity, Urine: 1.015 (ref 1.005–1.030)
pH: 6 (ref 5.0–8.0)

## 2024-04-21 LAB — BASIC METABOLIC PANEL WITH GFR
Anion gap: 11 (ref 5–15)
BUN: 10 mg/dL (ref 8–23)
CO2: 24 mmol/L (ref 22–32)
Calcium: 9.2 mg/dL (ref 8.9–10.3)
Chloride: 103 mmol/L (ref 98–111)
Creatinine, Ser: 0.91 mg/dL (ref 0.61–1.24)
GFR, Estimated: >60 mL/min (ref >60)
Glucose, Bld: 98 mg/dL (ref 70–99)
Potassium: 3.8 mmol/L (ref 3.5–5.1)
Sodium: 138 mmol/L (ref 135–145)

## 2024-04-21 LAB — CBC
HCT: 43.2 % (ref 39.0–52.0)
Hemoglobin: 14 g/dL (ref 13.0–17.0)
MCH: 32.5 pg (ref 26.0–34.0)
MCHC: 32.4 g/dL (ref 30.0–36.0)
MCV: 100.2 fL — ABNORMAL HIGH (ref 80.0–100.0)
Platelets: 142 K/uL — ABNORMAL LOW (ref 150–400)
RBC: 4.31 MIL/uL (ref 4.22–5.81)
RDW: 13.4 % (ref 11.5–15.5)
WBC: 6.7 K/uL (ref 4.0–10.5)
nRBC: 0 % (ref 0.0–0.2)

## 2024-04-21 LAB — TROPONIN I (HIGH SENSITIVITY)
Troponin I (High Sensitivity): 9 ng/L (ref <18)
Troponin I (High Sensitivity): 8 ng/L (ref <18)

## 2024-04-21 MED ORDER — LIDOCAINE VISCOUS HCL 2 % MT SOLN
15.0000 mL | Freq: Once | OROMUCOSAL | Status: AC
  Administered 2024-04-21: 15 mL via OROMUCOSAL
  Filled 2024-04-21: qty 15

## 2024-04-21 NOTE — ED Provider Notes (Addendum)
 [place] EMERGENCY DEPARTMENT AT [facility] Provider Note   CSN: [id number] Arrival date & time: 04/21/24  0321     Patient presents with: Chest Pain   Robert Buck is a 63 y.o. male.   63 year old male presents with complaint of pain in his rectum.  He states that he has enlarged prostate and he thinks he has an infection in his prostate again.  He reports this to be recurrent problem.  He denies fevers or abdominal pain.  He also states that he thinks that he pulled a muscle in his chest as he has had left-sided chest pain for the past week.  Pain has been constant, worse with palpation of his chest and worse with coughing.  Cough is productive with green sputum.  Asthma history of hypertension, sleep apnea, TBI, cardiomyopathy, seizures, COPD.       Prior to Admission medications   Medication Sig Start Date End Date Taking? Authorizing Provider  aspirin  81 MG EC tablet Take 1 tablet (81 mg total) by mouth daily. Patient not taking: Reported on 04/15/2019 03/06/13   Nicholaus Leita LABOR, NP  calcium  carbonate (TUMS - DOSED IN MG ELEMENTAL CALCIUM ) 500 MG chewable tablet Chew 1 tablet (200 mg of elemental calcium  total) by mouth daily as needed for heartburn. 02/23/13   Cater, Lauraine LELON, MD  HYDROcodone -acetaminophen  (NORCO/VICODIN) 5-325 MG tablet Take 1 tablet by mouth every 6 (six) hours as needed for severe pain. 08/03/19   Midge Golas, MD  ibuprofen  (ADVIL ) 200 MG tablet Take 200 mg by mouth every 6 (six) hours as needed for fever or moderate pain.    [provider]  ibuprofen  (ADVIL ) 800 MG tablet Take 1 tablet (800 mg total) by mouth 3 (three) times daily. Patient not taking: Reported on 04/15/2019 04/09/19   Dean Clarity, MD  metoprolol  succinate (TOPROL -XL) 50 MG 24 hr tablet Take 1 tablet (50 mg total) by mouth daily. Take with or immediately following a meal. Patient not taking: Reported on 04/15/2019 03/06/13   Nicholaus Leita LABOR, NP  Multiple Vitamin  (MULTIVITAMIN WITH MINERALS) TABS tablet Take 1 tablet by mouth daily. 03/06/13   Nicholaus Leita LABOR, NP  QUEtiapine  (SEROQUEL ) 100 MG tablet Take 1 tablet (100 mg total) by mouth at bedtime. Patient not taking: Reported on 04/15/2019 03/06/13   Nicholaus Leita LABOR, NP  traZODone  (DESYREL ) 50 MG tablet Take 1 tablet (50 mg total) by mouth at bedtime as needed and may repeat dose one time if needed for sleep. 03/06/13   Nicholaus Leita LABOR, NP    Allergies: Lisinopril    Review of Systems Negative except as per HPI Updated Vital Signs BP 128/78   Pulse 60   Temp 98.6 F (37 C)   Resp (!) 21   Ht 5' 10 (1.778 m)   Wt 86.2 kg   SpO2 95%   BMI 27.26 kg/m   Physical Exam Vitals and nursing note reviewed. Exam conducted with a chaperone present.  Constitutional:      General: He is not in acute distress.    Appearance: He is well-developed. He is not diaphoretic.  HENT:     Head: Normocephalic and atraumatic.  Cardiovascular:     Rate and Rhythm: Normal rate and regular rhythm.     Heart sounds: Normal heart sounds.  Pulmonary:     Effort: Pulmonary effort is normal.     Breath sounds: Normal breath sounds.  Chest:     Chest wall: Tenderness present.  Abdominal:     Palpations: Abdomen is soft.     Tenderness: There is no abdominal tenderness.  Genitourinary:    Comments: Chaparoned external rectal exam with attending MD, no crepitus, no hemorrhoids, no erythema, no abscess, no changes to the perineum.  Musculoskeletal:     Right lower leg: No tenderness. No edema.     Left lower leg: No tenderness. No edema.  Skin:    General: Skin is warm and dry.     Findings: No erythema or rash.  Neurological:     Mental Status: He is alert and oriented to person, place, and time.  Psychiatric:        Behavior: Behavior normal.     (all labs ordered are listed, but only abnormal results are displayed) Labs Reviewed  CBC - Abnormal; Notable for the following components:      Result Value    MCV 100.2 (*)    Platelets 142 (*)    All other components within normal limits  BASIC METABOLIC PANEL WITH GFR  URINALYSIS, ROUTINE W REFLEX MICROSCOPIC  TROPONIN I (HIGH SENSITIVITY)  TROPONIN I (HIGH SENSITIVITY)    EKG: None  Radiology: DG Chest 2 View Result Date: 04/21/2024 EXAM: 2 VIEW(S) XRAY OF THE CHEST 04/21/2024 04:00:44 AM COMPARISON: 04/18/2024 CLINICAL HISTORY: chest pain FINDINGS: LUNGS AND PLEURA: Bandlike opacity left lung base consistent with subsegmental atelectasis or scarring. Lower lung volumes. No pulmonary edema. No pleural effusion. No pneumothorax. HEART AND MEDIASTINUM: Tortuous thoracic aorta. BONES AND SOFT TISSUES: Thoracic degenerative changes. LIMITATIONS/ARTIFACTS: External artifact projecting over the left hemidiaphragm on the PA view. IMPRESSION: 1. No acute cardiopulmonary abnormality. Electronically signed by: Helayne Hurst MD 04/21/2024 04:19 AM EST RP Workstation: [id number]     Procedures   Medications Ordered in the ED  lidocaine  (XYLOCAINE ) 2 % viscous mouth solution 15 mL (15 mLs Mouth/Throat Given 04/21/24 9372)                                    Medical Decision Making Amount and/or Complexity of Data Reviewed Labs: ordered. Radiology: ordered.   This patient presents to the ED for concern of chest wall pain, rectal pain, this involves an extensive number of treatment options, and is a complaint that carries with it a high risk of complications and morbidity.  The differential diagnosis includes but not limited to, musculoskeletal pain, pneumonia, prostatitis, urinary tract infection, ACS   Co morbidities / Chronic conditions that complicate the patient evaluation  As reviewed per HPI   Additional history obtained:  Additional history obtained from EMR External records from outside source obtained and reviewed including hypertension, OSA on CPAP, alcohol use, tobacco abuse, TBI   Lab Tests:  I Ordered, and personally  interpreted labs.  The pertinent results include: Troponin is 9.  CBC without significant findings.  BMP within normal limits.  Urinalysis is normal.    Imaging Studies ordered:  I ordered imaging studies including chest x-ray I independently visualized and interpreted imaging which showed no acute process I agree with the radiologist interpretation   Cardiac Monitoring: / EKG:  The patient was maintained on a cardiac monitor.  I personally viewed and interpreted the cardiac monitored which showed an underlying rhythm of: Normal sinus rhythm, rate 74, not significant change compared to prior on file   Problem List / ED Course / Critical interventions / Medication management  63 year old male presents with complaint  of chest pain which is reproducible with palpation, worse with coughing.  Chest x-ray negative for pneumonia or other acute findings.  Troponin negative, repeat troponin is pending although suspect his chest pain is musculoskeletal.  Also reported pain in his rectum similar to prior prostate infections.  Urinalysis is negative.  He is afebrile, his abdomen is soft and nontender, he does not have an elevated white count today. He appears comfortable in the bed with no verbal cues of pain.  Repeat troponin is negative.  No obvious cause for patient's rectal pain.  Chest pain likely musculoskeletal.  Lungs clear to auscultation, do not suspect COPD exacerbation.  Patient to follow-up with [place] [facility] wellness and return to ER as needed I have reviewed the patients home medicines and have made adjustments as needed   Social Determinants of Health:  No PCP on file   Test / Admission - Considered:  Stable for discharge      Final diagnoses:  Chest wall pain  Rectal pain    ED Discharge Orders     None          Beverley Leita LABOR, PA-C 04/21/24 9386    Beverley Leita LABOR, PA-C 04/21/24 0630    Palumbo, April, MD 04/21/24 9368

## 2024-04-21 NOTE — Discharge Instructions (Addendum)
 Follow-up with [place] [facility] wellness for recheck. Return to the ER for worsening or concerning symptoms.

## 2024-04-21 NOTE — ED Notes (Signed)
 Pt trying to urinate. Unsuccessful so far.

## 2024-04-21 NOTE — ED Notes (Signed)
 Pt receive discharge and follow up education. Pt verbalized understanding.

## 2024-04-21 NOTE — ED Triage Notes (Addendum)
 Pt c/o left sided chest pain w/coughing and movement. Pt has had a productive cough for past 5 days. Sputum is dark orange/green per pt. Pt states he feels like his prostate is also enlarged. Patients hands are also swollen.

## 2024-04-24 ENCOUNTER — Emergency Department (HOSPITAL_COMMUNITY)
Admission: EM | Admit: 2024-04-24 | Discharge: 2024-04-24 | Disposition: A | Discharge status: Home / Self Care | Attending: Emergency Medicine | Admitting: Emergency Medicine

## 2024-04-24 ENCOUNTER — Encounter (HOSPITAL_COMMUNITY): Payer: Self-pay

## 2024-04-24 ENCOUNTER — Other Ambulatory Visit: Payer: Self-pay

## 2024-04-24 DIAGNOSIS — R109 Unspecified abdominal pain: Secondary | ICD-10-CM | POA: Diagnosis present

## 2024-04-24 DIAGNOSIS — Z7982 Long term (current) use of aspirin: Secondary | ICD-10-CM | POA: Diagnosis not present

## 2024-04-24 DIAGNOSIS — K5900 Constipation, unspecified: Secondary | ICD-10-CM | POA: Diagnosis not present

## 2024-04-24 LAB — I-STAT CHEM 8, ED
BUN: 14 mg/dL (ref 8–23)
Calcium, Ion: 1.08 mmol/L — ABNORMAL LOW (ref 1.15–1.40)
Chloride: 104 mmol/L (ref 98–111)
Creatinine, Ser: 1.1 mg/dL (ref 0.61–1.24)
Glucose, Bld: 103 mg/dL — ABNORMAL HIGH (ref 70–99)
HCT: 45 % (ref 39.0–52.0)
Hemoglobin: 15.3 g/dL (ref 13.0–17.0)
Potassium: 3.8 mmol/L (ref 3.5–5.1)
Sodium: 138 mmol/L (ref 135–145)
TCO2: 24 mmol/L (ref 22–32)

## 2024-04-24 NOTE — ED Triage Notes (Signed)
 Pt has been constipated/ not passing gas for a week. No nausea/ vomiting. C/O abd pain/ rectal pain. 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

## 2024-04-24 NOTE — Discharge Instructions (Signed)
 Return at any time for evaluation of your issues.  Please understand that by leaving AGAINST MEDICAL ADVICE it is possible that you could suffer severe injury up to and including death and or severe morbidity or mortality.  We invite you to return at any time we are open 24 hours a day 365 days a year

## 2024-04-24 NOTE — ED Notes (Signed)
 Pt is leaving AMA, PA notified.

## 2024-04-24 NOTE — ED Provider Triage Note (Signed)
 Emergency Medicine Provider Triage Evaluation Note  Robert Buck , a 62 y.o. male  was evaluated in triage.  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  Patient reports he has been try to make a bowel movement for for 1 week he has used over-the-counter laxative without relief he feels like he has firm stool trapped in his rectum..  Review of Systems  Positive: Abdominal pain Negative: Vomiting  Physical Exam  BP (!) 139/90 (BP Location: Right Arm)   Pulse 77   Temp 99.8 F (37.7 C) (Oral)   Resp 17   Ht 5' 10 (1.778 m)   Wt 83.9 kg   SpO2 96%   BMI 26.54 kg/m  Gen:   Awake, no distress   Resp:  Normal effort  MSK:   Moves extremities without difficulty  Other:  Soft abdomen without guarding or distention  Medical Decision Making  Medically screening exam initiated at 1:35 PM.  Appropriate orders placed.  FLEMON KELTY was informed that the remainder of the evaluation will be completed by another provider, this initial triage assessment does not replace that evaluation, and the importance of remaining in the ED until their evaluation is complete.     Arloa Chroman, PA-C 04/24/24 1336

## 2024-04-24 NOTE — ED Provider Notes (Signed)
 [place] EMERGENCY DEPARTMENT AT [facility] Provider Note   CSN: [id number] Arrival date & time: 04/24/24  1321     Patient presents with: No chief complaint on file.   Robert Buck is a 63 y.o. male with a past medical history of previous colectomy with reanastomosis for colon mass.  He has been unable to make a bowel movement for 1 week he has tried over-the-counter enemas and laxatives without relief and feel that he may have an impaction.  He has difficulty sitting.  He has had some abdominal pain.  No nausea vomiting or distention   HPI     Prior to Admission medications   Medication Sig Start Date End Date Taking? Authorizing Provider  aspirin  81 MG EC tablet Take 1 tablet (81 mg total) by mouth daily. Patient not taking: Reported on 04/15/2019 03/06/13   Nicholaus Leita LABOR, NP  calcium  carbonate (TUMS - DOSED IN MG ELEMENTAL CALCIUM ) 500 MG chewable tablet Chew 1 tablet (200 mg of elemental calcium  total) by mouth daily as needed for heartburn. 02/23/13   Cater, Lauraine LELON, MD  HYDROcodone -acetaminophen  (NORCO/VICODIN) 5-325 MG tablet Take 1 tablet by mouth every 6 (six) hours as needed for severe pain. 08/03/19   Midge Golas, MD  ibuprofen  (ADVIL ) 200 MG tablet Take 200 mg by mouth every 6 (six) hours as needed for fever or moderate pain.    [provider]  ibuprofen  (ADVIL ) 800 MG tablet Take 1 tablet (800 mg total) by mouth 3 (three) times daily. Patient not taking: Reported on 04/15/2019 04/09/19   Dean Clarity, MD  metoprolol  succinate (TOPROL -XL) 50 MG 24 hr tablet Take 1 tablet (50 mg total) by mouth daily. Take with or immediately following a meal. Patient not taking: Reported on 04/15/2019 03/06/13   Nicholaus Leita LABOR, NP  Multiple Vitamin (MULTIVITAMIN WITH MINERALS) TABS tablet Take 1 tablet by mouth daily. 03/06/13   Nicholaus Leita LABOR, NP  QUEtiapine  (SEROQUEL ) 100 MG tablet Take 1 tablet (100 mg total) by mouth at bedtime. Patient not taking:  Reported on 04/15/2019 03/06/13   Nicholaus Leita LABOR, NP  traZODone  (DESYREL ) 50 MG tablet Take 1 tablet (50 mg total) by mouth at bedtime as needed and may repeat dose one time if needed for sleep. 03/06/13   Nicholaus Leita LABOR, NP    Allergies: Lisinopril    Review of Systems  Updated Vital Signs BP (!) 139/90 (BP Location: Right Arm)   Pulse 77   Temp 99.8 F (37.7 C) (Oral)   Resp 17   Ht 5' 10 (1.778 m)   Wt 83.9 kg   SpO2 96%   BMI 26.54 kg/m   Physical Exam Vitals and nursing note reviewed.  Constitutional:      General: He is not in acute distress.    Appearance: He is well-developed. He is not diaphoretic.  HENT:     Head: Normocephalic and atraumatic.  Eyes:     General: No scleral icterus.    Conjunctiva/sclera: Conjunctivae normal.  Cardiovascular:     Rate and Rhythm: Normal rate and regular rhythm.     Heart sounds: Normal heart sounds.  Pulmonary:     Effort: Pulmonary effort is normal. No respiratory distress.     Breath sounds: Normal breath sounds.  Abdominal:     General: There is no distension.     Palpations: Abdomen is soft.     Tenderness: There is no abdominal tenderness. There is no guarding.  Genitourinary:  Comments: Rectal exam deferred as we are currently in triage. Musculoskeletal:     Cervical back: Normal range of motion and neck supple.  Skin:    General: Skin is warm and dry.  Neurological:     Mental Status: He is alert.  Psychiatric:        Behavior: Behavior normal.     (all labs ordered are listed, but only abnormal results are displayed) Labs Reviewed  CBC WITH DIFFERENTIAL/PLATELET  COMPREHENSIVE METABOLIC PANEL WITH GFR  LIPASE, BLOOD  I-STAT CHEM 8, ED    EKG: None  Radiology: No results found.   Procedures   Medications Ordered in the ED - No data to display                                  Medical Decision Making Amount and/or Complexity of Data Reviewed Labs: ordered.  Abnormal Labs Reviewed - No  abnormal labs to display,  Patient initially seen for evaluation of his issues.  He began asking frequently how long the wait is and stated that he no longer wanted to stay for evaluation due to the fact that he is having trouble sitting on his bottom and does not want to sit in the lobby for hours because he has pain.  He asked that we not place his IV.  Patient appears to have capacity to make this decision he is encouraged to return at any time for full evaluation he understands it we cannot rule out any emergent issues.  Date: 04/24/2024 Patient: Robert Buck Admitted: 04/24/2024  1:27 PM Attending Provider: Rogelia Jerilynn RAMAN, MD  Robert Buck or his authorized caregiver has made the decision for the patient to leave the emergency department against the advice of Rogelia Jerilynn RAMAN, MD.  He or his authorized caregiver has been informed and understands the inherent risks, including death.  He or his authorized caregiver has decided to accept the responsibility for this decision. Robert Buck and all necessary parties have been advised that he may return for further evaluation or treatment. His condition at time of discharge was Stable.  Robert Buck had current vital signs as follows:  Blood pressure (!) 139/90, pulse 77, temperature 99.8 F (37.7 C), temperature source Oral, resp. rate 17, height 5' 10 (1.778 m), weight 83.9 kg, SpO2 96%.    04/24/2024       Final diagnoses:  Constipation, unspecified constipation type    ED Discharge Orders     None          Arloa Chroman, PA-C 04/24/24 1401    Rogelia Jerilynn RAMAN, MD 04/24/24 1723
